# Patient Record
Sex: Female | Born: 1945 | Race: White | Hispanic: No | Smoking: Never smoker
Health system: Southern US, Community
[De-identification: ages and names within clinical notes are randomized; demographics above are authoritative.]

## PROBLEM LIST (undated history)

## (undated) DIAGNOSIS — R011 Cardiac murmur, unspecified: Secondary | ICD-10-CM

## (undated) HISTORY — PX: AMPUTATION OF REPLICATED TOES: SHX1136

---

## 2005-05-10 ENCOUNTER — Other Ambulatory Visit: Admission: RE | Admit: 2005-05-10 | Discharge: 2005-05-10 | Payer: Self-pay | Admitting: Family Medicine

## 2008-10-22 ENCOUNTER — Emergency Department (HOSPITAL_COMMUNITY): Admission: EM | Admit: 2008-10-22 | Discharge: 2008-10-22 | Payer: Self-pay | Admitting: Emergency Medicine

## 2012-04-09 DIAGNOSIS — W57XXXA Bitten or stung by nonvenomous insect and other nonvenomous arthropods, initial encounter: Secondary | ICD-10-CM | POA: Diagnosis not present

## 2012-04-09 DIAGNOSIS — T148 Other injury of unspecified body region: Secondary | ICD-10-CM | POA: Diagnosis not present

## 2012-04-10 ENCOUNTER — Encounter (HOSPITAL_COMMUNITY): Payer: Self-pay | Admitting: *Deleted

## 2012-04-10 ENCOUNTER — Emergency Department (HOSPITAL_COMMUNITY)
Admission: EM | Admit: 2012-04-10 | Discharge: 2012-04-11 | Disposition: A | Discharge status: Home / Self Care | Payer: Medicare Other | Attending: Emergency Medicine | Admitting: Emergency Medicine

## 2012-04-10 DIAGNOSIS — N390 Urinary tract infection, site not specified: Secondary | ICD-10-CM | POA: Diagnosis not present

## 2012-04-10 HISTORY — DX: Cardiac murmur, unspecified: R01.1

## 2012-04-10 LAB — URINE MICROSCOPIC-ADD ON

## 2012-04-10 LAB — CBC WITH DIFFERENTIAL/PLATELET
Basophils Absolute: 0.1 10*3/uL (ref 0.0–0.1)
Basophils Relative: 0 % (ref 0–1)
Eosinophils Relative: 0 % (ref 0–5)
HCT: 39.9 % (ref 36.0–46.0)
Hemoglobin: 13.8 g/dL (ref 12.0–15.0)
Lymphocytes Relative: 14 % (ref 12–46)
MCHC: 34.6 g/dL (ref 30.0–36.0)
MCV: 95.7 fL (ref 78.0–100.0)
Monocytes Absolute: 0.9 10*3/uL (ref 0.1–1.0)
Monocytes Relative: 7 % (ref 3–12)
RDW: 12.7 % (ref 11.5–15.5)

## 2012-04-10 LAB — URINALYSIS, ROUTINE W REFLEX MICROSCOPIC
Glucose, UA: NEGATIVE mg/dL
Ketones, ur: 40 mg/dL — AB
Protein, ur: >300 mg/dL — AB

## 2012-04-10 LAB — BASIC METABOLIC PANEL
BUN: 15 mg/dL (ref 6–23)
CO2: 27 mEq/L (ref 19–32)
Calcium: 9.8 mg/dL (ref 8.4–10.5)
Creatinine, Ser: 0.76 mg/dL (ref 0.50–1.10)

## 2012-04-10 LAB — CK TOTAL AND CKMB (NOT AT ARMC): Total CK: 80 U/L (ref 7–177)

## 2012-04-10 MED ORDER — DEXTROSE 5 % IV SOLN
1.0000 g | Freq: Once | INTRAVENOUS | Status: AC
  Administered 2012-04-10: 1 g via INTRAVENOUS
  Filled 2012-04-10: qty 10

## 2012-04-10 NOTE — ED Provider Notes (Signed)
History     CSN: [id number]  Arrival date & time 04/10/12  4098   First MD Initiated Contact with Patient 04/10/12 2304      Chief Complaint  Patient presents with  . Hematuria    (Consider location/radiation/quality/duration/timing/severity/associated sxs/prior treatment) HPI Hx per PT. Hematuria today with mild suprapubic discomfort. No f/C, no n.V, no back pain.  No dysuria, some frequency tonight.  PT worried b/c she was swarmed by yellow jackets yesterday, evaluated in PCP after hours clinic and took benadryl last night before bed.  No there medications. No vag bleeding or rectal bleeding, no ABd pain otherwise.  Past Medical History  Diagnosis Date  . Heart murmur     History reviewed. No pertinent past surgical history.  No family history on file.  History  Substance Use Topics  . Smoking status: Never Smoker   . Smokeless tobacco: Not on file  . Alcohol Use: Yes     social    OB History    Grav Para Term Preterm Abortions TAB SAB Ect Mult Living                  Review of Systems  Constitutional: Negative for fever and chills.  HENT: Negative for neck pain and neck stiffness.   Eyes: Negative for pain.  Respiratory: Negative for shortness of breath.   Cardiovascular: Negative for chest pain.  Gastrointestinal: Negative for constipation and abdominal distention.  Genitourinary: Positive for hematuria. Negative for dysuria.  Musculoskeletal: Negative for back pain.  Skin: Negative for pallor.  Neurological: Negative for headaches.  All other systems reviewed and are negative.    Allergies  Review of patient's allergies indicates no known allergies.  Home Medications  No current outpatient prescriptions on file.  BP 132/68  Pulse 83  Temp 98.3 F (36.8 C) (Oral)  Resp 20  SpO2 100%  Physical Exam  Constitutional: She is oriented to person, place, and time. She appears well-developed and well-nourished.  HENT:  Head: Normocephalic and  atraumatic.  Eyes: Conjunctivae and EOM are normal. Pupils are equal, round, and reactive to light.  Neck: Trachea normal. Neck supple. No thyromegaly present.  Cardiovascular: Normal rate, regular rhythm, S1 normal, S2 normal and normal pulses.     No systolic murmur is present   No diastolic murmur is present  Pulses:      Radial pulses are 2+ on the right side, and 2+ on the left side.  Pulmonary/Chest: Effort normal and breath sounds normal. She has no wheezes. She has no rhonchi. She has no rales. She exhibits no tenderness.  Abdominal: Soft. Normal appearance and bowel sounds are normal. There is no CVA tenderness and negative Murphy's sign.       Minimal suprapubic tenderness, no tenderness otherwise.   Musculoskeletal:       BLE:s Calves nontender, no cords or erythema, negative Homans sign  Neurological: She is alert and oriented to person, place, and time. She has normal strength. No cranial nerve deficit or sensory deficit. GCS eye subscore is 4. GCS verbal subscore is 5. GCS motor subscore is 6.  Skin: Skin is warm and dry. She is not diaphoretic.  Psychiatric: Her speech is normal.       Cooperative and appropriate    ED Course  Procedures (including critical care time)  Results for orders placed during the hospital encounter of 04/10/12  URINALYSIS, ROUTINE W REFLEX MICROSCOPIC      Component Value Range   Color, Urine RED (*)  YELLOW   APPearance TURBID (*) CLEAR   Specific Gravity, Urine 1.020  1.005 - 1.030   pH 6.5  5.0 - 8.0   Glucose, UA NEGATIVE  NEGATIVE mg/dL   Hgb urine dipstick LARGE (*) NEGATIVE   Bilirubin Urine LARGE (*) NEGATIVE   Ketones, ur 40 (*) NEGATIVE mg/dL   Protein, ur >098 (*) NEGATIVE mg/dL   Urobilinogen, UA 2.0 (*) 0.0 - 1.0 mg/dL   Nitrite POSITIVE (*) NEGATIVE   Leukocytes, UA LARGE (*) NEGATIVE  CBC WITH DIFFERENTIAL      Component Value Range   WBC 13.2 (*) 4.0 - 10.5 K/uL   RBC 4.17  3.87 - 5.11 MIL/uL   Hemoglobin 13.8  12.0 -  15.0 g/dL   HCT 11.9  14.7 - 82.9 %   MCV 95.7  78.0 - 100.0 fL   MCH 33.1  26.0 - 34.0 pg   MCHC 34.6  30.0 - 36.0 g/dL   RDW 56.2  13.0 - 86.5 %   Platelets 187  150 - 400 K/uL   Neutrophils Relative 79 (*) 43 - 77 %   Neutro Abs 10.4 (*) 1.7 - 7.7 K/uL   Lymphocytes Relative 14  12 - 46 %   Lymphs Abs 1.9  0.7 - 4.0 K/uL   Monocytes Relative 7  3 - 12 %   Monocytes Absolute 0.9  0.1 - 1.0 K/uL   Eosinophils Relative 0  0 - 5 %   Eosinophils Absolute 0.1  0.0 - 0.7 K/uL   Basophils Relative 0  0 - 1 %   Basophils Absolute 0.1  0.0 - 0.1 K/uL  BASIC METABOLIC PANEL      Component Value Range   Sodium 139  135 - 145 mEq/L   Potassium 4.1  3.5 - 5.1 mEq/L   Chloride 102  96 - 112 mEq/L   CO2 27  19 - 32 mEq/L   Glucose, Bld 90  70 - 99 mg/dL   BUN 15  6 - 23 mg/dL   Creatinine, Ser 7.84  0.50 - 1.10 mg/dL   Calcium 9.8  8.4 - 69.6 mg/dL   GFR calc non Af Amer 87 (*) >90 mL/min   GFR calc Af Amer >90  >90 mL/min  CK TOTAL AND CKMB      Component Value Range   Total CK 80  7 - 177 U/L   CK, MB 2.2  0.3 - 4.0 ng/mL   Relative Index RELATIVE INDEX IS INVALID  0.0 - 2.5  URINE MICROSCOPIC-ADD ON      Component Value Range   Squamous Epithelial / LPF RARE  RARE   WBC, UA TOO NUMEROUS TO COUNT  <3 WBC/hpf   RBC / HPF TOO NUMEROUS TO COUNT  <3 RBC/hpf   Bacteria, UA FEW (*) RARE   Casts HYALINE CASTS (*) NEGATIVE    IV rocephin  U cx pending  UTI without fever or vomiting or back pain or clinical pyelo/ sepsis.   Plan d/c home, PCP follow up recheck and follow up culture results MDM   VS and nursing notes reviewed. IVFs. IV ABx. Labs as above - crt 0.76, total CK WNL  UA reviewed - treated for UTI        Sunnie Nielsen, MD 04/11/12 0004

## 2012-04-10 NOTE — ED Notes (Signed)
Pt stung by 27 yellow jackets yesterday; seen by Urgent care; today developed increasing amts of hematuria with slight abd cramping; noticed "specs" in urine; was told to come to ED by PCP

## 2012-04-11 MED ORDER — CEFPODOXIME PROXETIL 200 MG PO TABS: 200.0000 mg | ORAL_TABLET | Freq: Two times a day (BID) | ORAL | Status: AC

## 2012-04-11 NOTE — ED Notes (Signed)
Patient is alert and oriented x3.  She was given DC instructions and follow up visit instructions.  Patient gave verbal understanding. She was DC ambulatory under his own power to home.  V/S stable.  He was not showing any signs of distress on DC 

## 2012-04-14 LAB — URINE CULTURE: Colony Count: >=100000

## 2012-04-15 NOTE — ED Notes (Signed)
+   Urine Patient treated with Vantin-sensitive to same-chart appended per protocol MD. 

## 2012-08-07 DIAGNOSIS — Z23 Encounter for immunization: Secondary | ICD-10-CM | POA: Diagnosis not present

## 2012-12-25 ENCOUNTER — Other Ambulatory Visit: Payer: Self-pay

## 2012-12-25 ENCOUNTER — Ambulatory Visit
Admission: RE | Admit: 2012-12-25 | Discharge: 2012-12-25 | Disposition: A | Discharge status: Home / Self Care | Payer: Medicare Other | Source: Ambulatory Visit

## 2012-12-25 DIAGNOSIS — R0602 Shortness of breath: Secondary | ICD-10-CM

## 2012-12-25 DIAGNOSIS — R5383 Other fatigue: Secondary | ICD-10-CM | POA: Diagnosis not present

## 2012-12-25 DIAGNOSIS — R509 Fever, unspecified: Secondary | ICD-10-CM | POA: Diagnosis not present

## 2012-12-25 DIAGNOSIS — R5381 Other malaise: Secondary | ICD-10-CM | POA: Diagnosis not present

## 2012-12-31 DIAGNOSIS — R509 Fever, unspecified: Secondary | ICD-10-CM | POA: Diagnosis not present

## 2013-01-07 ENCOUNTER — Other Ambulatory Visit: Payer: Self-pay

## 2013-01-07 ENCOUNTER — Other Ambulatory Visit (HOSPITAL_COMMUNITY)
Admission: RE | Admit: 2013-01-07 | Discharge: 2013-01-07 | Disposition: A | Discharge status: Home / Self Care | Payer: Medicare Other | Source: Ambulatory Visit

## 2013-01-07 DIAGNOSIS — N951 Menopausal and female climacteric states: Secondary | ICD-10-CM | POA: Diagnosis not present

## 2013-01-07 DIAGNOSIS — Z124 Encounter for screening for malignant neoplasm of cervix: Secondary | ICD-10-CM | POA: Diagnosis not present

## 2013-01-07 DIAGNOSIS — Z1211 Encounter for screening for malignant neoplasm of colon: Secondary | ICD-10-CM | POA: Diagnosis not present

## 2013-01-07 DIAGNOSIS — Z23 Encounter for immunization: Secondary | ICD-10-CM | POA: Diagnosis not present

## 2013-01-07 DIAGNOSIS — R634 Abnormal weight loss: Secondary | ICD-10-CM | POA: Diagnosis not present

## 2013-01-07 DIAGNOSIS — Z136 Encounter for screening for cardiovascular disorders: Secondary | ICD-10-CM | POA: Diagnosis not present

## 2013-01-07 DIAGNOSIS — Z0000 Encounter for general adult medical examination without abnormal findings: Secondary | ICD-10-CM | POA: Diagnosis not present

## 2013-08-19 DIAGNOSIS — Z23 Encounter for immunization: Secondary | ICD-10-CM | POA: Diagnosis not present

## 2013-10-14 DIAGNOSIS — J40 Bronchitis, not specified as acute or chronic: Secondary | ICD-10-CM | POA: Diagnosis not present

## 2014-03-03 DIAGNOSIS — Z23 Encounter for immunization: Secondary | ICD-10-CM | POA: Diagnosis not present

## 2014-03-03 DIAGNOSIS — N951 Menopausal and female climacteric states: Secondary | ICD-10-CM | POA: Diagnosis not present

## 2014-03-03 DIAGNOSIS — Z1211 Encounter for screening for malignant neoplasm of colon: Secondary | ICD-10-CM | POA: Diagnosis not present

## 2014-03-03 DIAGNOSIS — Z0000 Encounter for general adult medical examination without abnormal findings: Secondary | ICD-10-CM | POA: Diagnosis not present

## 2014-03-08 DIAGNOSIS — Z136 Encounter for screening for cardiovascular disorders: Secondary | ICD-10-CM | POA: Diagnosis not present

## 2014-03-08 DIAGNOSIS — Z131 Encounter for screening for diabetes mellitus: Secondary | ICD-10-CM | POA: Diagnosis not present

## 2014-03-08 DIAGNOSIS — Z0000 Encounter for general adult medical examination without abnormal findings: Secondary | ICD-10-CM | POA: Diagnosis not present

## 2014-03-09 DIAGNOSIS — H2510 Age-related nuclear cataract, unspecified eye: Secondary | ICD-10-CM | POA: Diagnosis not present

## 2014-03-28 ENCOUNTER — Other Ambulatory Visit: Payer: Self-pay

## 2014-03-28 DIAGNOSIS — Z1231 Encounter for screening mammogram for malignant neoplasm of breast: Secondary | ICD-10-CM

## 2014-03-30 ENCOUNTER — Ambulatory Visit
Admission: RE | Admit: 2014-03-30 | Discharge: 2014-03-30 | Disposition: A | Discharge status: Home / Self Care | Payer: Medicare Other | Source: Ambulatory Visit

## 2014-03-30 DIAGNOSIS — Z1231 Encounter for screening mammogram for malignant neoplasm of breast: Secondary | ICD-10-CM

## 2014-04-05 DIAGNOSIS — N951 Menopausal and female climacteric states: Secondary | ICD-10-CM | POA: Diagnosis not present

## 2014-04-05 DIAGNOSIS — M810 Age-related osteoporosis without current pathological fracture: Secondary | ICD-10-CM | POA: Diagnosis not present

## 2014-04-15 IMAGING — CR DG CHEST 2V
2 series · 2 of 2 positions shown · non-contrast
Comparison: None.

CLINICAL DATA: Short of breath, fever, chills, recent weight loss

CHEST - 2 VIEW

[view not recorded (1 of 2)]
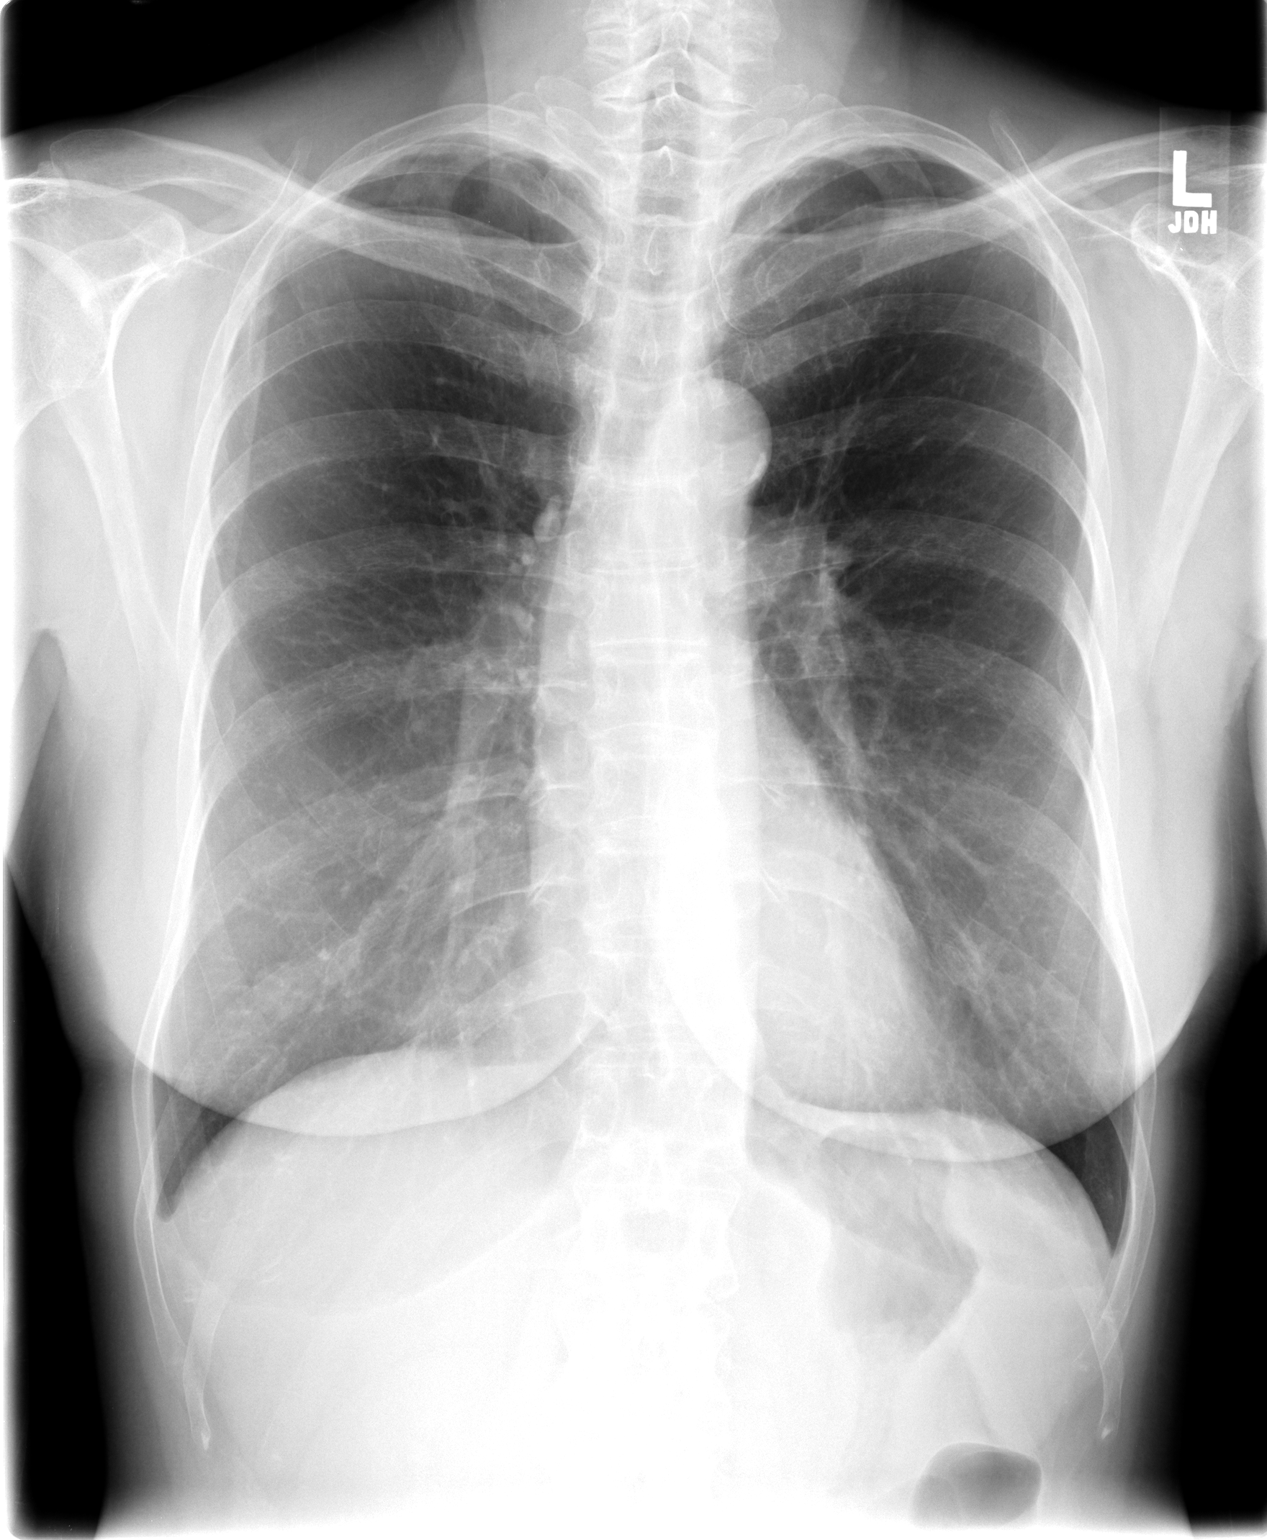

[view not recorded (2 of 2)]
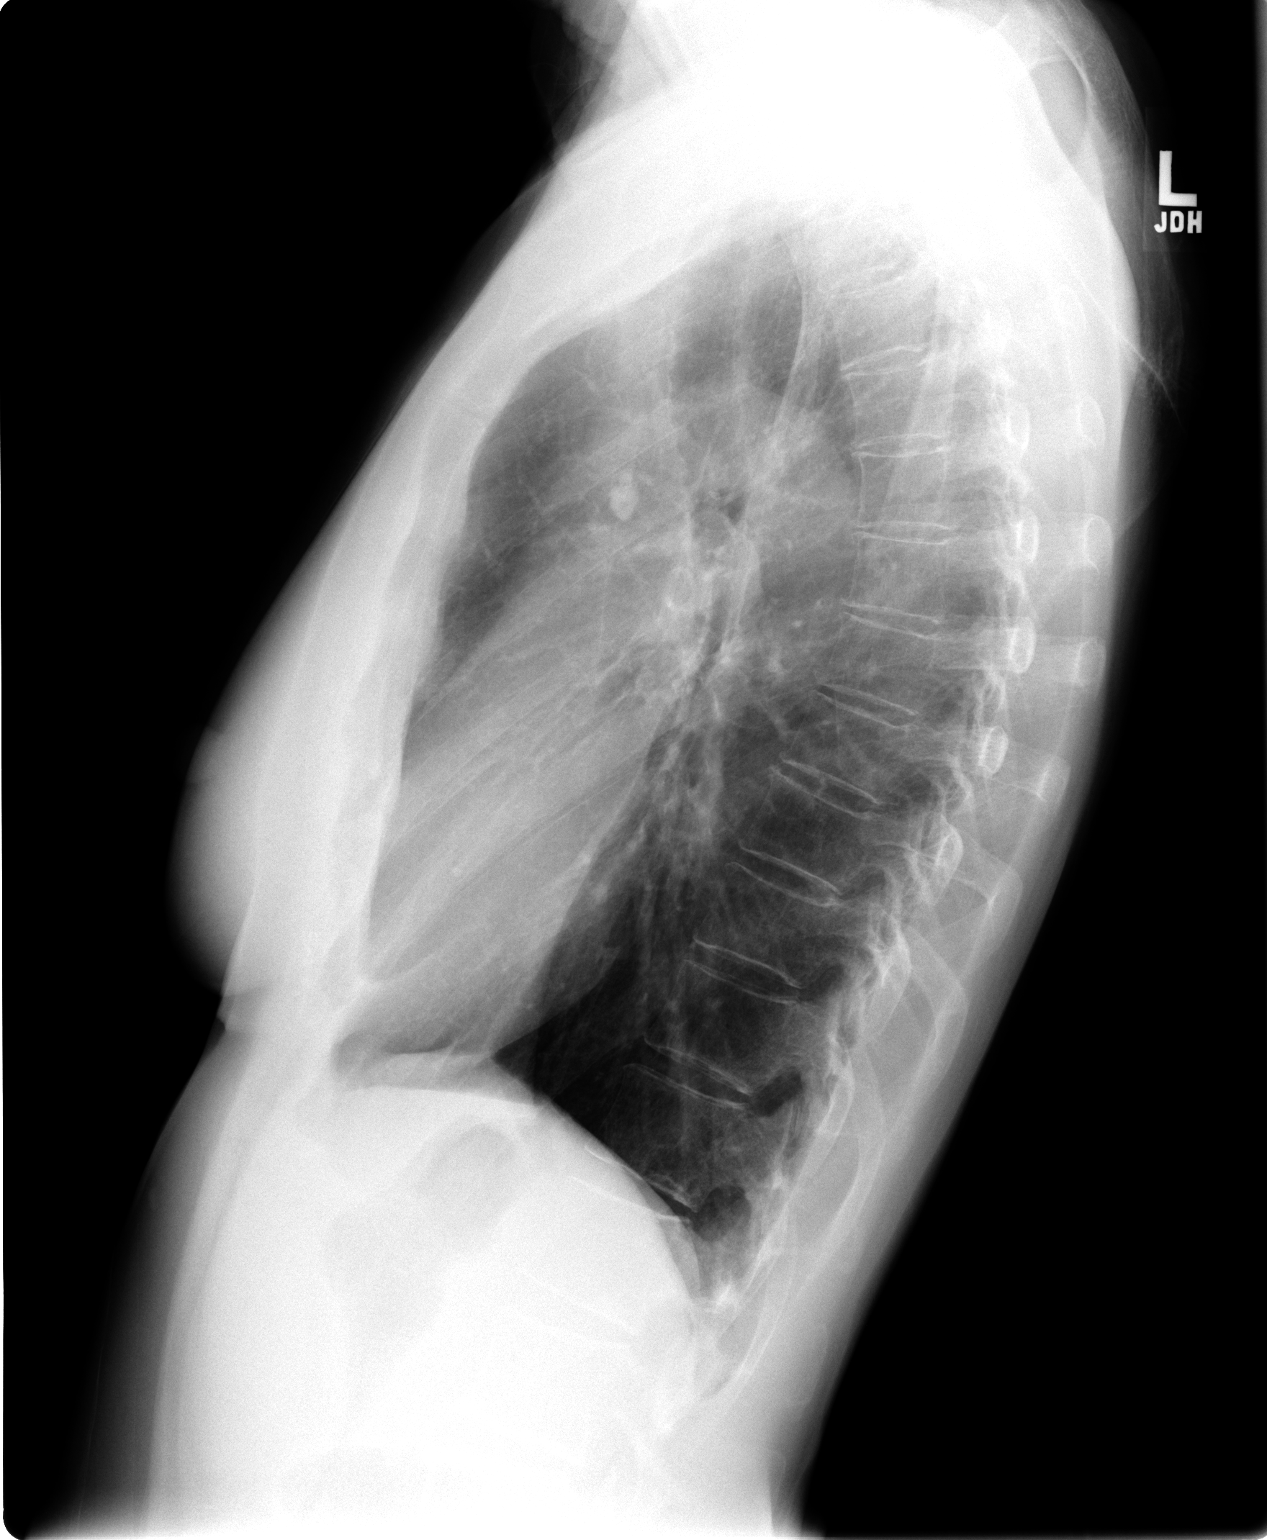

[2 of 2 positions shown; findings below may reference images not displayed]

FINDINGS: No active infiltrate or effusion is seen.  There do
appear to be calcified right paratracheal nodes which may indicate
prior granulomatous disease.  Mediastinal contours are normal.  The
heart is within normal limits in size.  No bony abnormality is
seen.
IMPRESSION: No active lung disease.  Calcified left peritracheal nodes may
indicate prior granulomatous disease.

## 2014-04-20 DIAGNOSIS — M810 Age-related osteoporosis without current pathological fracture: Secondary | ICD-10-CM | POA: Diagnosis not present

## 2014-04-20 DIAGNOSIS — E559 Vitamin D deficiency, unspecified: Secondary | ICD-10-CM | POA: Diagnosis not present

## 2014-07-19 DIAGNOSIS — M25531 Pain in right wrist: Secondary | ICD-10-CM | POA: Diagnosis not present

## 2014-07-19 DIAGNOSIS — Z23 Encounter for immunization: Secondary | ICD-10-CM | POA: Diagnosis not present

## 2014-07-20 ENCOUNTER — Other Ambulatory Visit: Payer: Self-pay | Admitting: Family Medicine

## 2014-07-20 ENCOUNTER — Ambulatory Visit
Admission: RE | Admit: 2014-07-20 | Discharge: 2014-07-20 | Disposition: A | Discharge status: Home / Self Care | Payer: Medicare Other | Source: Ambulatory Visit | Attending: Family Medicine | Admitting: Family Medicine

## 2014-07-20 DIAGNOSIS — M25531 Pain in right wrist: Secondary | ICD-10-CM

## 2016-06-04 ENCOUNTER — Other Ambulatory Visit: Payer: Self-pay | Admitting: Family Medicine

## 2016-06-04 ENCOUNTER — Ambulatory Visit
Admission: RE | Admit: 2016-06-04 | Discharge: 2016-06-04 | Disposition: A | Discharge status: Home / Self Care | Payer: Medicare Other | Source: Ambulatory Visit | Attending: Family Medicine | Admitting: Family Medicine

## 2016-06-04 DIAGNOSIS — M25552 Pain in left hip: Secondary | ICD-10-CM

## 2016-06-04 DIAGNOSIS — M25551 Pain in right hip: Secondary | ICD-10-CM

## 2019-04-21 ENCOUNTER — Other Ambulatory Visit: Payer: Self-pay | Admitting: Family Medicine

## 2019-04-21 DIAGNOSIS — Z1231 Encounter for screening mammogram for malignant neoplasm of breast: Secondary | ICD-10-CM

## 2019-06-03 ENCOUNTER — Ambulatory Visit
Admission: RE | Admit: 2019-06-03 | Discharge: 2019-06-03 | Disposition: A | Discharge status: Home / Self Care | Payer: Medicare Other | Source: Ambulatory Visit | Attending: Family Medicine | Admitting: Family Medicine

## 2019-06-03 ENCOUNTER — Other Ambulatory Visit: Payer: Self-pay

## 2019-06-03 DIAGNOSIS — Z1231 Encounter for screening mammogram for malignant neoplasm of breast: Secondary | ICD-10-CM

## 2019-10-04 ENCOUNTER — Encounter: Payer: Self-pay | Admitting: Neurology

## 2019-11-09 ENCOUNTER — Encounter: Payer: Self-pay | Admitting: Neurology

## 2019-11-09 ENCOUNTER — Ambulatory Visit: Payer: Medicare Other | Admitting: Neurology

## 2019-11-09 ENCOUNTER — Other Ambulatory Visit: Payer: Self-pay

## 2019-11-09 VITALS — BP 145/82 | HR 65 | Ht 68.0 in | Wt 123.8 lb

## 2019-11-09 DIAGNOSIS — G3184 Mild cognitive impairment, so stated: Secondary | ICD-10-CM

## 2019-11-09 NOTE — Patient Instructions (Addendum)
1. Schedule MRI brain without contrast, once your insurance has been authorized. [facility] will contact you. If you have not heard from them give them a call at [phone]  2. Bloodwork from Dr. Hyman Hopes will be requested for review  3. Take the B12 supplement daily  4. Continue to monitor stress, mood, as these can also affect our memory  5. Follow-up in 6 months, call for any changes   RECOMMENDATIONS FOR ALL PATIENTS WITH MEMORY PROBLEMS: 1. Continue to exercise (Recommend 30 minutes of walking everyday, or 3 hours every week) 2. Increase social interactions - continue going to [place] and enjoy social gatherings with friends and family 3. Eat healthy, avoid fried foods and eat more fruits and vegetables 4. Maintain adequate blood pressure, blood sugar, and blood cholesterol level. Reducing the risk of stroke and cardiovascular disease also helps promoting better memory. 5. Avoid stressful situations. Live a simple life and avoid aggravations. Organize your time and prepare for the next day in anticipation. 6. Sleep well, avoid any interruptions of sleep and avoid any distractions in the bedroom that may interfere with adequate sleep quality 7. Avoid sugar, avoid sweets as there is a strong link between excessive sugar intake, diabetes, and cognitive impairment We discussed the Mediterranean diet, which has been shown to help patients reduce the risk of progressive memory disorders and reduces cardiovascular risk. This includes eating fish, eat fruits and green leafy vegetables, nuts like almonds and hazelnuts, walnuts, and also use olive oil. Avoid fast foods and fried foods as much as possible. Avoid sweets and sugar as sugar use has been linked to worsening of memory function.

## 2019-11-09 NOTE — Progress Notes (Signed)
NEUROLOGY CONSULTATION NOTE  Sarah Christian MRN: [id number] DOB: March 21, 1946  Referring provider: Dr. Shirlean Christian Primary care provider: Dr. Shirlean Christian  Reason for consult:  Memory loss  Dear Dr Sarah Christian:  Thank you for your kind referral of Sarah Christian for consultation of the above symptoms. Although her history is well known to you, please allow me to reiterate it for the purpose of our medical record. She is alone in the office today. Records and images were personally reviewed where available.   HISTORY OF PRESENT ILLNESS: This is a pleasant 74 year old right-handed woman with no significant past medical history presenting for evaluation of memory loss. She reports family members started noticing memory changes a few months ago, she helps take care of her grandchildren and her daughter-in-law noticed there were certain things she may not remember and would have to ask again. Her son talked to her about not remembering the schedule for her grandbaby. She lives alone and denies getting lost driving. No missed bill payments. She was not taking medication regularly until she was recently started on B12 supplements after she had a B12 level of 247 last 09/2019. She states she is not good with taking this regularly. She denies misplacing things or leaving the stove on. She reports poor sleep recently due to stress. Her ex-husband who she has been separated from for 15 years moved from [place] to live with their son in [place] but had issues, so they decided he move in with her several weeks ago so they can keep each other company. This has ben stressful for her, she feels her mood is a lot better, but notes different personality from her ex-husband. Her mother had memory issues. No history of concussions. She drinks alcohol on occasion.  She has occasional back pain. She denies any headaches, dizziness, diplopia, dysarthria, dysphagia, neck pain, focal numbness/tingling/weakness, bowel/bladder  dysfunction. No anosmia, tremors, no falls.  Laboratory Data: Labs done 2/21: TSH 1.10, B12 247  PAST MEDICAL HISTORY: Past Medical History:  Diagnosis Date  . Heart murmur     PAST SURGICAL HISTORY: Past Surgical History:  Procedure Laterality Date  . AMPUTATION OF REPLICATED TOES Bilateral     MEDICATIONS: Current Outpatient Medications on File Prior to Visit  Medication Sig Dispense Refill  . acetaminophen (TYLENOL) 325 MG tablet Take 650 mg by mouth every 6 (six) hours as needed.    Marland Kitchen aspirin 81 MG tablet Take 81 mg by mouth daily.     No current facility-administered medications on file prior to visit.    ALLERGIES: Allergies  Allergen Reactions  . Tessalon [Benzonatate]     FAMILY HISTORY: Family History  Problem Relation Age of Onset  . Alzheimer's disease Mother   . Diabetes Sister   . Diabetes Brother     SOCIAL HISTORY: Social History   Socioeconomic History  . Marital status: Widowed    Spouse name: Not on file  . Number of children: Not on file  . Years of education: Not on file  . Highest education level: Not on file  Occupational History  . Not on file  Tobacco Use  . Smoking status: Never Smoker  Substance and Sexual Activity  . Alcohol use: Yes    Alcohol/week: 1.0 standard drinks    Types: 1 Glasses of wine per week    Comment: social  . Drug use: Not on file  . Sexual activity: Not on file  Other Topics Concern  . Not on  file  Social History Narrative   Right handed   Lives one story   Lives with a alone, has ex-husband there for right now   Social Determinants of Health   Financial Resource Strain:   . Difficulty of Paying Living Expenses:   Food Insecurity:   . Worried About Charity fundraiser in the Last Year:   . Arboriculturist in the Last Year:   Transportation Needs:   . Film/video editor (Medical):   Marland Kitchen Lack of Transportation (Non-Medical):   Physical Activity:   . Days of Exercise per Week:   . Minutes of  Exercise per Session:   Stress:   . Feeling of Stress :   Social Connections:   . Frequency of Communication with Friends and Family:   . Frequency of Social Gatherings with Friends and Family:   . Attends Religious Services:   . Active Member of Clubs or Organizations:   . Attends Archivist Meetings:   Marland Kitchen Marital Status:   Intimate Partner Violence:   . Fear of Current or Ex-Partner:   . Emotionally Abused:   Marland Kitchen Physically Abused:   . Sexually Abused:     REVIEW OF SYSTEMS: Constitutional: No fevers, chills, or sweats, no generalized fatigue, change in appetite Eyes: No visual changes, double vision, eye pain Ear, nose and throat: No hearing loss, ear pain, nasal congestion, sore throat Cardiovascular: No chest pain, palpitations Respiratory:  No shortness of breath at rest or with exertion, wheezes GastrointestinaI: No nausea, vomiting, diarrhea, abdominal pain, fecal incontinence Genitourinary:  No dysuria, urinary retention or frequency Musculoskeletal:  No neck pain,+back pain Integumentary: No rash, pruritus, skin lesions Neurological: as above Psychiatric: No depression, insomnia, anxiety Endocrine: No palpitations, fatigue, diaphoresis, mood swings, change in appetite, change in weight, increased thirst Hematologic/Lymphatic:  No anemia, purpura, petechiae. Allergic/Immunologic: no itchy/runny eyes, nasal congestion, recent allergic reactions, rashes  PHYSICAL EXAM: Vitals:   11/09/19 1303  BP: (!) 145/82  Pulse: 65  SpO2: 99%   General: No acute distress Head:  Normocephalic/atraumatic Skin/Extremities: No rash, no edema Neurological Exam: Mental status: alert and oriented to person, place, and time, no dysarthria or aphasia, Fund of knowledge is appropriate.  Remote memory  intact.  Attention and concentration are normal.    SLUMS score 23/30 [employer] Mental Exam 11/09/2019  Weekday Correct 1  Current year 1  What state are we in? 1    Amount spent 1  Amount left 2  # of Animals 2  5 objects recall 1  Number series 2  Hour markers 2  Time correct 2  Placed X in triangle correctly 1  Largest Figure 1  Name of female 2  Date back to work 2  Type of work 2  State she lived in 0  Total score 23    Cranial nerves: CN I: not tested CN II: pupils equal, round and reactive to light, visual fields intact CN III, IV, VI:  full range of motion, no nystagmus, no ptosis CN V: facial sensation intact CN VII: upper and lower face symmetric CN VIII: hearing intact to conversation Bulk & Tone: normal, no fasciculations. Motor: 5/5 throughout with no pronator drift. Sensation: intact to light touch, cold, pin, vibration and joint position sense.  No extinction to double simultaneous stimulation.  Romberg test negative Deep Tendon Reflexes: +2 throughout, no ankle clonus Plantar responses: downgoing bilaterally Cerebellar: no incoordination on finger to nose testing Gait: narrow-based and steady, able  to tandem walk adequately. Tremor: none  IMPRESSION: This is a pleasant 74 year old right-handed woman with no significant past medical history presenting for evaluation of memory loss. Her neurological exam is non-focal, SLUMS score 23/30. She denies any difficulties with complex tasks, however is alone today with no family to corroborate history. 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 We discussed different causes of memory changes, she was advised to take B12 supplements regularly, goal level >400. MRI brain without contrast will be ordered to assess for underlying structural abnormality and assess vascular load. We also discussed effects of mood/stress on memory, she endorses stress with recent living situation, continue to monitor. Follow-up in 6 months, she knows to call for any changes.   Thank you for allowing me to participate in the care of this patient. Please do not hesitate to call for any questions or  concerns.   Patrcia Dolly, M.D.  CC: Dr. Hyman Christian

## 2019-12-20 NOTE — Progress Notes (Signed)
Cardiology Office Note   Date:  12/21/2019   ID:  [name], DOB [birth year], MRN [id number]  PCP:  Maurice Small, MD  Cardiologist:   Skeet Latch, MD   No chief complaint on file.     History of Present Illness: Sarah Christian is a 74 y.o. female with hyperlipidemia memory loss who presents for evaluation of new left bundle branch block at the request of Maurice Small, MD.  seh saw Dr. Justin Mend because she had an alleric reaction.  She had itching on her hands and feet.  She called EMS and they did an EKG.  EKG showed a LBBB.  She felt otherwise well.  She does yard work and walks for exercise.  She has no exertional chest pain or shortness of breath.  Her only complaints are orthopedic.  She has no lower extremity edema, orthopnea or PND.  She denies palpitations, lightheadedness or PND.  She notes that she has a heart murmur.      Past Medical History:  Diagnosis Date  . Heart murmur   . LBBB (left bundle branch block) 12/21/2019  . Memory loss 12/21/2019    Past Surgical History:  Procedure Laterality Date  . AMPUTATION OF REPLICATED TOES Bilateral      Current Outpatient Medications  Medication Sig Dispense Refill  . acetaminophen (TYLENOL) 325 MG tablet Take 650 mg by mouth every 6 (six) hours as needed.     No current facility-administered medications for this visit.    Allergies:   Tessalon [benzonatate]    Social History:  The patient  reports that she has never smoked. She has never used smokeless tobacco. She reports current alcohol use of about 1.0 standard drinks of alcohol per week.   Family History:  The patient's family history includes Alzheimer's disease in her mother; Diabetes in her brother and sister; Hypertension in her mother; Kidney disease in her sister.    ROS:  Please see the history of present illness.   Otherwise, review of systems are positive for none.   All other systems are reviewed and negative.    PHYSICAL EXAM: VS:  BP 124/78    Pulse 62   Ht 5\' 8"  (1.727 m)   Wt 122 lb 12.8 oz (55.7 kg)   BMI 18.67 kg/m  , BMI Body mass index is 18.67 kg/m. GENERAL:  Well appearing HEENT:  Pupils equal round and reactive, fundi not visualized, oral mucosa unremarkable NECK:  No jugular venous distention, waveform within normal limits, carotid upstroke brisk and symmetric, no bruits, no thyromegaly LYMPHATICS:  No cervical adenopathy LUNGS:  Clear to auscultation bilaterally HEART:  RRR.  PMI not displaced or sustained,S1 and S2 within normal limits, no S3, no S4, no clicks, no rubs, no murmurs ABD:  Flat, positive bowel sounds normal in frequency in pitch, no bruits, no rebound, no guarding, no midline pulsatile mass, no hepatomegaly, no splenomegaly EXT:  2 plus pulses throughout, no edema, no cyanosis no clubbing SKIN:  No rashes no nodules NEURO:  Cranial nerves II through XII grossly intact, motor grossly intact throughout PSYCH:  Cognitively intact, oriented to person place and time    EKG:  EKG is ordered today. The ekg ordered today demonstrates sinus rhythm.  Rate 62 bpm.  LBBB   Recent Labs: No results found for requested labs within last 8760 hours.   10/07/2019: WBC 4.6, hemoglobin 12.9, hematocrit 38.3, platelets 187 Sodium 139, potassium 4.5, BUN 16, creatinine 0.65 AST 22, ALT  17 TSH 1.1 Total cholesterol 187, triglycerides 61, HDL 55, LDL 119  Lipid Panel No results found for: CHOL, TRIG, HDL, CHOLHDL, VLDL, LDLCALC, LDLDIRECT    Wt Readings from Last 3 Encounters:  12/21/19 122 lb 12.8 oz (55.7 kg)  11/09/19 123 lb 12.8 oz (56.2 kg)      ASSESSMENT AND PLAN:  # LBBB:  [name] was incidentally noted to have a left bundle branch block.  She is completely asymptomatic.  She exercises regularly has no chest pain or shortness of breath.  She has no evidence of heart failure on exam or by report.  No further intervention necessary.  # Hyperlipidemia:   Continue follow-up with Dr. Hyman Hopes.  She is  monitoring this with diet and exercise.  Current medicines are reviewed at length with the patient today.  The patient does not have concerns regarding medicines.  The following changes have been made:  no change  Labs/ tests ordered today include:  No orders of the defined types were placed in this encounter.    Disposition:   FU with Tracy Kinner C. Duke Salvia, MD, [facility] as needed     Signed, Ksenia Kunz C. Duke Salvia, MD, [facility]  12/21/2019 9:35 AM    [facility]

## 2019-12-21 ENCOUNTER — Ambulatory Visit (INDEPENDENT_AMBULATORY_CARE_PROVIDER_SITE_OTHER): Payer: Medicare Other | Admitting: Cardiovascular Disease

## 2019-12-21 ENCOUNTER — Encounter: Payer: Self-pay | Admitting: Cardiovascular Disease

## 2019-12-21 ENCOUNTER — Other Ambulatory Visit: Payer: Self-pay

## 2019-12-21 DIAGNOSIS — I447 Left bundle-branch block, unspecified: Secondary | ICD-10-CM | POA: Diagnosis not present

## 2019-12-21 DIAGNOSIS — R413 Other amnesia: Secondary | ICD-10-CM

## 2019-12-21 HISTORY — DX: Left bundle-branch block, unspecified: I44.7

## 2019-12-21 NOTE — Patient Instructions (Signed)
Medication Instructions:  ?Your physician recommends that you continue on your current medications as directed. Please refer to the Current Medication list given to you today.  ? ?Labwork: ?NONE ? ?Testing/Procedures: ?NONE ? ?Follow-Up: ?AS NEEDED  ? ?  ?

## 2020-01-03 NOTE — Addendum Note (Signed)
Addended by: Chana Bode on: 01/03/2020 04:38 PM   Modules accepted: Orders

## 2020-05-15 ENCOUNTER — Ambulatory Visit (INDEPENDENT_AMBULATORY_CARE_PROVIDER_SITE_OTHER): Payer: Medicare Other | Admitting: Neurology

## 2020-05-15 ENCOUNTER — Encounter: Payer: Self-pay | Admitting: Neurology

## 2020-05-15 ENCOUNTER — Other Ambulatory Visit: Payer: Self-pay

## 2020-05-15 VITALS — BP 131/67 | HR 75 | Resp 18 | Ht 68.0 in | Wt 121.0 lb

## 2020-05-15 DIAGNOSIS — G3184 Mild cognitive impairment, so stated: Secondary | ICD-10-CM

## 2020-05-15 NOTE — Progress Notes (Signed)
NEUROLOGY FOLLOW UP OFFICE NOTE  Sarah Christian [id number] 09/25/1945  HISTORY OF PRESENT ILLNESS: I had the pleasure of seeing Sarah Christian in follow-up in the neurology clinic on 05/15/2020.  The patient was last seen 6 months ago for memory loss.  Records and images were personally reviewed where available.  SLUMS score 23/30 in 10/2019. She did not have MRI brain done. She is alone in the office today and states she is overall doing fine. She forgets what she went to get in a room. She lives alone. She denies getting lost driving, no missed bills, most are online. She is not taking any regular medications. She denies leaving the stove on. She denies any difficulties using the computer. On her prior visit, she reported a lot of stress with her living situation with her ex-husband, he has since moved out and she endorses less stress. She denies any headaches, dizziness, focal numbness/tingling/weakness, no falls. Sleep and mood are good.    History on Initial Assessment 11/09/2019: This is a pleasant 74 year old right-handed woman with no significant past medical history presenting for evaluation of memory loss. She reports family members started noticing memory changes a few months ago, she helps take care of her grandchildren and her daughter-in-law noticed there were certain things she may not remember and would have to ask again. Her son talked to her about not remembering the schedule for her grandbaby. She lives alone and denies getting lost driving. No missed bill payments. She was not taking medication regularly until she was recently started on B12 supplements after she had a B12 level of 247 last 09/2019. She states she is not good with taking this regularly. She denies misplacing things or leaving the stove on. She reports poor sleep recently due to stress. Her ex-husband who she has been separated from for 15 years moved from [place] to live with their son in [place] but had issues, so they  decided he move in with her several weeks ago so they can keep each other company. This has ben stressful for her, she feels her mood is a lot better, but notes different personality from her ex-husband. Her mother had memory issues. No history of concussions. She drinks alcohol on occasion.  She has occasional back pain. She denies any headaches, dizziness, diplopia, dysarthria, dysphagia, neck pain, focal numbness/tingling/weakness, bowel/bladder dysfunction. No anosmia, tremors, no falls.  Laboratory Data: Labs done 2/21: TSH 1.10, B12 247   PAST MEDICAL HISTORY: Past Medical History:  Diagnosis Date   Heart murmur    LBBB (left bundle branch block) 12/21/2019   Memory loss 12/21/2019    MEDICATIONS: Current Outpatient Medications on File Prior to Visit  Medication Sig Dispense Refill   acetaminophen (TYLENOL) 325 MG tablet Take 650 mg by mouth every 6 (six) hours as needed. (Patient not taking: Reported on 05/15/2020)     No current facility-administered medications on file prior to visit.    ALLERGIES: Allergies  Allergen Reactions   Tessalon [Benzonatate]     FAMILY HISTORY: Family History  Problem Relation Age of Onset   Alzheimer's disease Mother    Hypertension Mother    Diabetes Sister    Kidney disease Sister    Diabetes Brother     SOCIAL HISTORY: Social History   Socioeconomic History   Marital status: Widowed    Spouse name: Not on file   Number of children: Not on file   Years of education: Not on file   Highest  education level: Not on file  Occupational History   Not on file  Tobacco Use   Smoking status: Never Smoker   Smokeless tobacco: Never Used  Vaping Use   Vaping Use: Never used  Substance and Sexual Activity   Alcohol use: Yes    Alcohol/week: 1.0 standard drink    Types: 1 Glasses of wine per week    Comment: social   Drug use: Not on file   Sexual activity: Not on file  Other Topics Concern   Not on file    Social History Narrative   Right handed   Lives one story   Lives with a alone, has ex-husband there for right now   Social Determinants of Health   Financial Resource Strain:    Difficulty of Paying Living Expenses: Not on file  Food Insecurity:    Worried About Running Out of Food in the Last Year: Not on file   [employer] of Food in the Last Year: Not on file  Transportation Needs:    Lack of Transportation (Medical): Not on file   Lack of Transportation (Non-Medical): Not on file  Physical Activity:    Days of Exercise per Week: Not on file   Minutes of Exercise per Session: Not on file  Stress:    Feeling of Stress : Not on file  Social Connections:    Frequency of Communication with Friends and Family: Not on file   Frequency of Social Gatherings with Friends and Family: Not on file   Attends Religious Services: Not on file   Active Member of Clubs or Organizations: Not on file   Attends Banker Meetings: Not on file   Marital Status: Not on file  Intimate Partner Violence:    Fear of Current or Ex-Partner: Not on file   Emotionally Abused: Not on file   Physically Abused: Not on file   Sexually Abused: Not on file     PHYSICAL EXAM: Vitals:   05/15/20 1428  BP: 131/67  Pulse: 75  Resp: 18  SpO2: 99%   General: No acute distress Head:  Normocephalic/atraumatic Skin/Extremities: No rash, no edema Neurological Exam: alert and oriented to person, place, and time. No aphasia or dysarthria. Fund of knowledge is appropriate.  Recent and remote memory are impaired.  Attention and concentration are normal. MOCA 24/30 Montreal Cognitive Assessment  05/15/2020  Visuospatial/ Executive (0/5) 5  Naming (0/3) 3  Attention: Read list of digits (0/2) 2  Attention: Read list of letters (0/1) 1  Attention: Serial 7 subtraction starting at 100 (0/3) 3  Language: Repeat phrase (0/2) 2  Language : Fluency (0/1) 1  Abstraction (0/2) 2  Delayed  Recall (0/5) 0  Orientation (0/6) 6  Total 25     Cranial nerves: Pupils equal, round. Extraocular movements intact with no nystagmus. Visual fields full.  No facial asymmetry.  Motor: Bulk and tone normal, muscle strength 5/5 throughout with no pronator drift.   Finger to nose testing intact.  Gait narrow-based and steady, able to tandem walk adequately.  Romberg negative.   IMPRESSION: This is a pleasant 74 yo RH woman with no significant past medical history with memory loss. MOCA score today 25/30, she denies any difficulties with complex tasks however she is alone in the office with no family to corroborate history. Symptoms suggestive of amnestic MCI. We discussed proceed with MRI brain without contrast as previously discussed to assess for underlying structural abnormality and assess vascular load. She will  be scheduled for Neurocognitive testing to further evaluate cognitive concerns. We again discussed the importance of control of vascular risk factors, physical exercise, and brain stimulation exercises for brain health. Follow-up in 6 months, she knows to call for any changes.    Thank you for allowing me to participate in her care.  Please do not hesitate to call for any questions or concerns.   Patrcia Dolly, M.D.   CC: Dr. Hyman Hopes

## 2020-05-15 NOTE — Patient Instructions (Addendum)
1. Schedule MRI brain without contrast  2. Scheduled Neurocognitive testing  3. Follow-up in 6 months, call for any changes   RECOMMENDATIONS FOR ALL PATIENTS WITH MEMORY PROBLEMS: 1. Continue to exercise (Recommend 30 minutes of walking everyday, or 3 hours every week) 2. Increase social interactions - continue going to [place] and enjoy social gatherings with friends and family 3. Eat healthy, avoid fried foods and eat more fruits and vegetables 4. Maintain adequate blood pressure, blood sugar, and blood cholesterol level. Reducing the risk of stroke and cardiovascular disease also helps promoting better memory. 5. Avoid stressful situations. Live a simple life and avoid aggravations. Organize your time and prepare for the next day in anticipation. 6. Sleep well, avoid any interruptions of sleep and avoid any distractions in the bedroom that may interfere with adequate sleep quality 7. Avoid sugar, avoid sweets as there is a strong link between excessive sugar intake, diabetes, and cognitive impairment The Mediterranean diet has been shown to help patients reduce the risk of progressive memory disorders and reduces cardiovascular risk. This includes eating fish, eat fruits and green leafy vegetables, nuts like almonds and hazelnuts, walnuts, and also use olive oil. Avoid fast foods and fried foods as much as possible. Avoid sweets and sugar as sugar use has been linked to worsening of memory function.  There is always a concern of gradual progression of memory problems. If this is the case, then we may need to adjust level of care according to patient needs. Support, both to the patient and caregiver, should then be put into place.   We have sent a referral to [facility] for your MRI and they will call you directly to schedule your appointment. They are located at [address] [facility]. If you need to contact them directly please call [phone].

## 2020-06-29 ENCOUNTER — Other Ambulatory Visit: Payer: Self-pay

## 2020-06-29 ENCOUNTER — Ambulatory Visit: Payer: Medicare Other | Admitting: Psychology

## 2020-06-29 ENCOUNTER — Ambulatory Visit (INDEPENDENT_AMBULATORY_CARE_PROVIDER_SITE_OTHER): Payer: Medicare Other | Admitting: Psychology

## 2020-06-29 ENCOUNTER — Encounter: Payer: Self-pay | Admitting: Psychology

## 2020-06-29 DIAGNOSIS — G3184 Mild cognitive impairment, so stated: Secondary | ICD-10-CM | POA: Insufficient documentation

## 2020-06-29 DIAGNOSIS — R011 Cardiac murmur, unspecified: Secondary | ICD-10-CM | POA: Insufficient documentation

## 2020-06-29 DIAGNOSIS — R4189 Other symptoms and signs involving cognitive functions and awareness: Secondary | ICD-10-CM

## 2020-06-29 HISTORY — DX: Mild cognitive impairment of uncertain or unknown etiology: G31.84

## 2020-06-29 NOTE — Progress Notes (Addendum)
NEUROPSYCHOLOGICAL EVALUATION [place] Department of Neurology  Date of Evaluation: June 29, 2020  Reason for Referral:   Sarah LawsSusan L Christian is a 74 y.o. right-handed Caucasian female referred by Patrcia DollyKaren Aquino, M.D., to characterize her current cognitive functioning and assist with diagnostic clarity and treatment planning in the context of subjective cognitive decline and family history of Alzheimer's disease.   Assessment and Plan:   Clinical Impression(s): Sarah Christian's pattern of performance is suggestive of impairment across all aspects of verbal memory. Additional relative weaknesses (i.e., below average range) were exhibited across aspects of visual memory, as well as verbal fluency. Performance was appropriate across processing speed, attention/concentration, executive functioning, safety/judgment, receptive language, confrontation naming, and visuospatial abilities. Sarah Christian denied difficulties completing instrumental activities of daily living (ADLs) independently. 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  The etiology of her isolated verbal memory impairment is unclear. Ongoing memory dysfunction certainly raises the risk for the presence of Alzheimer's disease. However, Sarah Christian's performance was quite strong in other non-memory areas commonly affected by this condition, even in earlier stages. There is no neuroimaging available to examine possible anatomical correlates for ongoing dysfunction. She does have a family history of Alzheimer's disease and continued medical monitoring will be very important for her moving forward. Memory dysfunction is above and beyond what would be expected from stress or mild mood-related concerns alone. Cognitive and behavioral characteristics were not consistent with other forms of  neurodegenerative illness, such as Lewy body or frontotemporal dementia.   Recommendations: A repeat neuropsychological evaluation in 18 months (or sooner if functional decline is noted) is recommended to assess the trajectory of future cognitive decline should it occur. This will also aid in future efforts towards improved diagnostic clarity.  It appears that a brain MRI has been ordered but is not yet scheduled. I would strongly encourage Sarah Christian to schedule and complete neuroimaging. This will be important in understanding if there are any anatomical explanations for ongoing memory loss. It will also allow for the tracking of changes over time.   If interested, Sarah Christian could speak with Dr. Karel JarvisAquino regarding the pros and cons of a acetylcholinesterase inhibitor medication for ongoing memory loss. However, it is important to highlight that in the face of a degenerative condition, these medications may slow functional decline and cannot stop or reverse cognitive changes.   It will be important for Sarah Christian to have another person with her when in situations where she may need to process information, weigh the pros and cons of different options, and make decisions, in order to ensure that she fully understands and recalls all information to be considered.  If not already done, Sarah Christian and her family may want to discuss her wishes regarding durable power of attorney and medical decision making, so that she can have input into these choices. Additionally, they may wish to discuss future plans for caretaking and seek out community options for in home/residential care should they become necessary.  Sarah Christian is encouraged to attend to lifestyle factors for brain health (e.g., regular physical exercise, good nutrition habits, regular participation in cognitively-stimulating activities, and general stress management techniques), which are likely to have benefits for both emotional adjustment and  cognition. Continued participation in activities which provide mental stimulation and social interaction is also recommended.   Information important to remember should be provided  in written format in all instances. This should be placed in a highly visible and commonly frequented area of her home to promote recall.  Review of Records:   Ms. Brandenburger was seen by [facility] Patrcia Dolly, M.D.) on 05/15/2020 for follow-up of reported memory loss. Briefly, Ms. Kehres reported family members noticing memory changes towards the end of 2020 and early 2021. She helps take care of her grandchildren and her daughter-in-law noticed there were certain things Ms. Yetter may not remember and would have to ask again. Her son also reportedly talked to her about not remembering the schedule for her grandchild. She typically lives alone and denied difficulties performing ADLs independently. Acutely, she reported poor sleep and increased stress due to family dynamic difficulties. Specifically, her ex-husband recently moved from [place] to live with their son in [place]. However, living issues arose and her ex-husband eventually moved in with Ms. Woodford. She reported significantly decreased stress and the ability to resume her normal routine following her ex-husband moving back out. She denied ongoing headaches, dizziness, focal numbness/tingling/weakness, balance concerns, sleep dysfunction, or psychiatric distress. Performance on a brief cognitive screening instrument (MOCA) was 25/30. Ultimately, Ms. Lawson was referred for a comprehensive neuropsychological evaluation to characterize her cognitive abilities and to assist with diagnostic clarity and treatment planning.   During her clinical interview, she made comments both to myself and the psychometrist independently that she had previously been to this office and completed a neuropsychological evaluation. However, I have no records of ever meeting or evaluating  Sarah Christian previously, nor do I have records of her completing a previous evaluation at another clinic or with another provider.   A brain MRI had been ordered but not completed at the time of the evaluation. As such, no neuroimaging was available for review.   Past Medical History:  Diagnosis Date  . Heart murmur   . LBBB (left bundle branch block) 12/21/2019    Past Surgical History:  Procedure Laterality Date  . AMPUTATION OF REPLICATED TOES Bilateral     Current Outpatient Medications:  .  acetaminophen (TYLENOL) 325 MG tablet, Take 650 mg by mouth every 6 (six) hours as needed. (Patient not taking: Reported on 05/15/2020), Disp: , Rfl:   Clinical Interview:   The following information was obtained during a clinical interview with Sarah Christian prior to cognitive testing.  Cognitive Symptoms: Decreased short-term memory: Largely denied. She did acknowledge instances in the recent past where she doubted her abilities to a generally mild extent. However, this was attributed to ongoing family-related stress (as described above), as well as self-isolation due to the ongoing COVID-19 pandemic.  Decreased long-term memory: Denied. Decreased attention/concentration: Denied. Reduced processing speed: Denied. Difficulties with executive functions: Denied. Difficulties with emotion regulation: Denied. Difficulties with receptive language: Denied. Difficulties with word finding: Denied. Decreased visuoperceptual ability: Denied.  Difficulties completing ADLs: Denied. She currently lives alone.   Additional Medical History: History of traumatic brain injury/concussion: Denied. History of stroke: Denied. History of seizure activity: Denied. History of known exposure to toxins: Denied. Symptoms of chronic pain: Denied. She did acknowledge diffuse pain when she overexerts herself and noted that she has not been engaging in regular exercise lately. However, no specific areas of localized pain  were reported.  Experience of frequent headaches/migraines: Denied. Frequent instances of dizziness/vertigo: Denied.  Sensory changes: She wears reading glasses with positive effect. Other sensory changes/difficulties (e.g., hearing, taste, or smell) were denied.  Balance/coordination difficulties: Denied. She also denied a history of  falls.  Other motor difficulties: Denied.  Sleep History: Estimated hours obtained each night: 8 hours.  Difficulties falling asleep: Denied. Difficulties staying asleep: Denied. She acknowledged difficulties staying asleep in the recent past due to her ex-husband temporarily moving in with her and his schedule being disruptive. However, these difficulties subsided after he moved back out.  Feels rested and refreshed upon awakening: Endorsed.  History of snoring: Denied. History of waking up gasping for air: Denied. Witnessed breath cessation while asleep: Denied.  History of vivid dreaming: Denied. Excessive movement while asleep: Denied. Instances of acting out her dreams: Denied.  Psychiatric/Behavioral Health History: Depression: She described her current mood as "good" and denied to her knowledge ever being diagnosed with any mental health concerns in the past. Despite this, she did acknowledge negative symptoms stemming from elongated pandemic-related isolation. She further noted that she has seemed to not engage in activities which previously provided her enjoyment for largely unknown reasons. Current or remote suicidal ideation, intent, or plan were denied.  Anxiety: Denied. Mania: Denied. Trauma History: Denied. Visual/auditory hallucinations: Denied. Delusional thoughts: Denied.  Tobacco: Denied. Alcohol: She denied recent alcohol consumption but did express a desire to consume around 1/2 glass of wine per night. She denied a history of problematic alcohol abuse or dependence.  Recreational drugs: Denied. Caffeine: 2 cups of coffee in the  morning. This represents a decrease from a previously higher degree of caffeine intake (i.e., 5 cups of coffee).   Family History: Problem Relation Age of Onset  . Alzheimer's disease Mother        Symptom onset late 60s/early 64s  . Hypertension Mother   . Diabetes Sister   . Kidney disease Sister   . Diabetes Brother    This information was confirmed by Ms. Hollice Espy.  Academic/Vocational History: Highest level of educational attainment: 16 years. She graduated from high school and earned a Oncologist in social work. She described herself as a strong (A) student in academic settings and denied any areas of relative weakness.  History of developmental delay: Denied. History of grade repetition: Denied. Enrollment in special education courses: Denied. History of LD/ADHD: Denied.  Employment: Retired. 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   Evaluation Results:   Behavioral Observations: Ms. Chinchilla was unaccompanied, arrived to her appointment on time, and was appropriately dressed and groomed. She appeared alert and oriented. Observed gait and station were within normal limits. Gross motor functioning appeared intact upon informal observation and no abnormal movements (e.g., tremors) were noted. Her affect was generally relaxed and positive, but did range appropriately given the subject being discussed during the clinical interview or the task at hand during testing procedures. Spontaneous speech was fluent and word finding difficulties were not observed during the clinical interview. Thought processes during interview were coherent and normal in content. Insight into her cognitive difficulties appeared limited regarding memory abilities as she largely denied all concerns despite there being significant impairments across cognitive testing. During testing, she was often tangential and would  require prompts from the psychometrist to provide answers across several tasks. She was also noted to laugh following instructions; this was conceptualized as representing anxiety relating to the testing procedures. Sustained attention was appropriate. Task engagement was adequate and she persisted when challenged. Overall, Sarah Christian was cooperative with the clinical interview and subsequent testing procedures.    Adequacy of Effort: The validity of neuropsychological testing is  limited by the extent to which the individual being tested may be assumed to have exerted adequate effort during testing. Ms. Denley expressed her intention to perform to the best of her abilities and exhibited adequate task engagement and persistence. Scores across stand-alone and embedded performance validity measures were variable but largely within expectation. One below expectation performance is believed to be due to true memory dysfunction rather than poor engagement or attempts to perform poorly. As such, the results of the current evaluation are believed to be a valid representation of Sarah Christian's current cognitive functioning.  Test Results: Ms. Chicas was somewhat disoriented at the time of the current evaluation. She incorrectly stated her current age ("30"). She also was unable to state the current date, day of the week, or time.   Intellectual abilities based upon educational and vocational attainment were estimated to be in the average range. Premorbid abilities were estimated to be within the average range based upon a single-word reading test.   Processing speed was mildly variable, ranging from the below average to above average normative ranges. Basic attention was well above average. More complex attention (e.g., working memory) was average. Executive functioning was average to above average.  Assessed receptive language abilities were above average. Likewise, Ms. Glanzer did not exhibit any difficulties  comprehending task instructions and answered all questions asked of her appropriately. Assessed expressive language was below average across verbal fluency measures but above average across a confrontation naming task.   Assessed visuospatial/visuoconstructional abilities were average.    Learning (i.e., encoding) of novel verbal information was exceptionally low to well below average while learning visual information was below average. Spontaneous delayed recall (i.e., retrieval) of previously learned information was commensurate with performance across learning trials. Retention rates were 71% across a story learning task, 0% across a list learning task, and 67% across a shape learning task. Performance across recognition tasks was exceptionally low across verbal tasks but average across a visual task, suggesting limited but some evidence for information consolidation.   Results of emotional screening instruments suggested that recent symptoms of generalized anxiety were in the minimal range, while symptoms of depression were within normal limits. A screening instrument assessing recent sleep quality suggested the presence of minimal sleep dysfunction.  Tables of Scores:   Note: This summary of test scores accompanies the interpretive report and should not be considered in isolation without reference to the appropriate sections in the text. Descriptors are based on appropriate normative data and may be adjusted based on clinical judgment. The terms "impaired" and "within normal limits (WNL)" are used when a more specific level of functioning cannot be determined.       Effort Testing:   DESCRIPTOR       Dot Counting Test: --- --- Within Expectation  NAB EVI: --- --- Below Expectation  D-KEFS Color Word Effort Index: --- --- Within Expectation       Orientation:      Raw Score Percentile   NAB Orientation, Form 1 22/29 --- ---       Cognitive Screening:           Raw Score Percentile     SLUMS: 22/30 --- ---       Intellectual Functioning:           Standard Score Percentile   Test of Premorbid Functioning: 101 53 Average       Memory:          NAB Memory Module, Form 2: Standard Score/  T Score Percentile   Total Memory Index 61 <1 Exceptionally Low  List Learning       Total Trials 1-3 8/36 (19) <1 Exceptionally Low    List B 6/12 (59) 82 Above Average    Short Delay Free Recall 0/12 (19) <1 Exceptionally Low    Long Delay Free Recall 0/12 (22) <1 Exceptionally Low    Retention Percentage 0 (26) 1 Exceptionally Low    Recognition Discriminability -7 (8) <1 Exceptionally Low  Shape Learning       Total Trials 1-3 12/27 (38) 12 Below Average    Delayed Recall 4/9 (39) 14 Below Average    Retention Percentage 67 (39) 14 Below Average    Recognition Discriminability 6 (46) 34 Average  Story Learning       Immediate Recall 35/80 (34) 5 Well Below Average    Delayed Recall 15/40 (33) 5 Well Below Average    Retention Percentage 71 (43) 25 Average  Daily Living Memory       Immediate Recall 36/51 (34) 5 Well Below Average    Delayed Recall 0/17 (19) <1 Exceptionally Low    Retention Percentage 0 (-7) <1 Exceptionally Low    Recognition Hits 2/10 (7) <1 Exceptionally Low       Attention/Executive Function:          Trail Making Test (TMT): Raw Score (T Score) Percentile     Part A 32 secs.,  0 errors (52) 58 Average    Part B 74 secs.,  1 error (54) 66 Average         Scaled Score Percentile   WAIS-IV Coding: 11 63 Average       NAB Attention Module, Form 1: T Score Percentile     Digits Forward 68 96 Well Above Average    Digits Backwards 55 69 Average       D-KEFS Color-Word Interference Test: Raw Score (Scaled Score) Percentile     Color Naming 41 secs. (7) 16 Below Average    Word Reading 22 secs. (12) 75 Above Average    Inhibition 78 secs. (9) 37 Average      Total Errors 4 errors (9) 37 Average    Inhibition/Switching 57 secs. (13) 84 Above  Average      Total Errors 0 errors (13) 84 Above Average       [employer] Test: Raw Score Percentile     Categories (trials) 4 (64) >16 Within Normal Limits    Total Errors 15 79 Above Average    Perseverative Errors 10 62 Average    Non-Perseverative Errors 5 84 Above Average    Failure to Maintain Set 0 --- ---       NAB Executive Functions Module, Form 1: T Score Percentile     Judgment 53 62 Average       Language:          Verbal Fluency Test: Raw Score (T Score) Percentile     Phonemic Fluency (FAS) 31 (40) 16 Below Average    Animal Fluency 16 (41) 18 Below Average        NAB Language Module, Form 1: T Score Percentile     Auditory Comprehension 57 75 Above Average    Naming 31/31 (58) 79 Above Average       Visuospatial/Visuoconstruction:      Raw Score Percentile   Clock Drawing: 10/10 --- Within Normal Limits       NAB Spatial Module, Form 1:  T Score Percentile     Figure Drawing Copy 50 50 Average        Scaled Score Percentile   WAIS-IV Block Design: 9 37 Average       Mood and Personality:      Raw Score Percentile   Geriatric Depression Scale: 5 --- Within Normal Limits  Geriatric Anxiety Scale: 5 --- Minimal    Somatic 1 --- Minimal    Cognitive 1 --- Minimal    Affective 3 --- Minimal       Additional Questionnaires:      Raw Score Percentile   PROMIS Sleep Disturbance Questionnaire: 10 --- None to Slight   Informed Consent and Coding/Compliance:   Ms. Senseney was provided with a verbal description of the nature and purpose of the present neuropsychological evaluation. Also reviewed were the foreseeable risks and/or discomforts and benefits of the procedure, limits of confidentiality, and mandatory reporting requirements of this provider. The patient was given the opportunity to ask questions and receive answers about the evaluation. Oral consent to participate was provided by the patient.   This evaluation was conducted by Newman Nickels,  Ph.D., licensed clinical neuropsychologist. Ms. Beavin completed a comprehensive clinical interview with Dr. Milbert Coulter, billed as one unit [phone], and 150 minutes of cognitive testing and scoring, billed as one unit [phone] and four additional units [id number]. Psychometrist Wallace Keller, B.S., assisted Dr. Milbert Coulter with test administration and scoring procedures. As a separate and discrete service, Dr. Milbert Coulter spent a total of 160 minutes in interpretation and report writing billed as one unit [phone] and two units [id number].

## 2020-06-29 NOTE — Progress Notes (Signed)
   Psychometrician Note   Cognitive testing was administered to Sarah Christian by Wallace Keller, B.S. (psychometrist) under the supervision of Dr. Newman Nickels, Ph.D., licensed psychologist on 06/29/20. [name] did not appear overtly distressed by the testing session per behavioral observation or responses across self-report questionnaires. Dr. Newman Nickels, Ph.D. checked in with [name] as needed to manage any distress related to testing procedures (if applicable). Rest breaks were offered.    The battery of tests administered was selected by Dr. Newman Nickels, Ph.D. with consideration to [name]'s current level of functioning, the nature of her symptoms, emotional and behavioral responses during interview, level of literacy, observed level of motivation/effort, and the nature of the referral question. This battery was communicated to the psychometrist. Communication between Dr. Newman Nickels, Ph.D. and the psychometrist was ongoing throughout the evaluation and Dr. Newman Nickels, Ph.D. was immediately accessible at all times. Dr. Newman Nickels, Ph.D. provided supervision to the psychometrist on the date of this service to the extent necessary to assure the quality of all services provided.    Sarah Christian will return within approximately 1-2 weeks for an interactive feedback session with Dr. Milbert Coulter at which time her test performances, clinical impressions, and treatment recommendations will be reviewed in detail. [name] understands she can contact our office should she require our assistance before this time.  A total of 150 minutes of billable time were spent face-to-face with [name] by the psychometrist. This includes both test administration and scoring time. Billing for these services is reflected in the clinical report generated by Dr. Newman Nickels, Ph.D..  This note reflects time spent with the psychometrician and does not include test scores or any clinical  interpretations made by Dr. Milbert Coulter. The full report will follow in a separate note.

## 2020-06-30 ENCOUNTER — Encounter: Payer: Self-pay | Admitting: Psychology

## 2020-07-11 ENCOUNTER — Encounter: Payer: Medicare Other | Admitting: Psychology

## 2020-07-13 ENCOUNTER — Other Ambulatory Visit: Payer: Self-pay

## 2020-07-13 ENCOUNTER — Ambulatory Visit (INDEPENDENT_AMBULATORY_CARE_PROVIDER_SITE_OTHER): Payer: Medicare Other | Admitting: Psychology

## 2020-07-13 DIAGNOSIS — R4181 Age-related cognitive decline: Secondary | ICD-10-CM

## 2020-07-13 DIAGNOSIS — G3184 Mild cognitive impairment, so stated: Secondary | ICD-10-CM

## 2020-07-13 NOTE — Patient Instructions (Signed)
A repeat neuropsychological evaluation in 18 months (or sooner if functional decline is noted) is recommended to assess the trajectory of future cognitive decline should it occur. This will also aid in future efforts towards improved diagnostic clarity.  It appears that a brain MRI has been ordered but is not yet scheduled. I would strongly encourage Sarah Christian to schedule and complete neuroimaging. This will be important in understanding if there are any anatomical explanations for ongoing memory loss. It will also allow for the tracking of changes over time.   If interested, Sarah Christian could speak with Dr. Karel Jarvis regarding the pros and cons of a acetylcholinesterase inhibitor medication for ongoing memory loss. However, it is important to highlight that in the face of a degenerative condition, these medications may slow functional decline and cannot stop or reverse cognitive changes.   It will be important for Sarah Christian to have another person with her when in situations where she may need to process information, weigh the pros and cons of different options, and make decisions, in order to ensure that she fully understands and recalls all information to be considered.  If not already done, Sarah Christian and her family may want to discuss her wishes regarding durable power of attorney and medical decision making, so that she can have input into these choices. Additionally, they may wish to discuss future plans for caretaking and seek out community options for in home/residential care should they become necessary.  Sarah Christian is encouraged to attend to lifestyle factors for brain health (e.g., regular physical exercise, good nutrition habits, regular participation in cognitively-stimulating activities, and general stress management techniques), which are likely to have benefits for both emotional adjustment and cognition. Continued participation in activities which provide mental stimulation and social  interaction is also recommended.   Information important to remember should be provided in written format in all instances. This should be placed in a highly visible and commonly frequented area of her home to promote recall.

## 2020-07-13 NOTE — Progress Notes (Signed)
   Neuropsychology Feedback Session [name]. [facility] [place] Department of Neurology  Reason for Referral:   Sarah Christian a 74 y.o. right-handed Caucasian female referred by Patrcia Dolly, M.D.,to characterize hercurrent cognitive functioning and assist with diagnostic clarity and treatment planning in the context of subjective cognitive decline and family history of Alzheimer's disease.   Feedback:   [name] completed a comprehensive neuropsychological evaluation on 06/29/2020. Please refer to that encounter for the full report and recommendations. Briefly, results suggested impairment across all aspects of verbal memory. Additional relative weaknesses (i.e., below average range) were exhibited across aspects of visual memory, as well as verbal fluency. The etiology of her isolated verbal memory impairment is unclear. Ongoing memory dysfunction certainly raises the risk for the presence of Alzheimer's disease. However, [name] performance was quite strong in other non-memory areas commonly affected by this condition, even in earlier stages. There is no neuroimaging available to examine possible anatomical correlates for ongoing dysfunction. She does have a family history of Alzheimer's disease and continued medical monitoring will be very important for her moving forward. Memory dysfunction is above and beyond what would be expected from stress or mild mood-related concerns alone.  [name] was unaccompanied during the feedback session. Content of the current session focused on the results of her neuropsychological evaluation. [name] was given the opportunity to ask questions and her questions were answered. She was encouraged to reach out should additional questions arise. A copy of her report was provided at the conclusion of the visit.      25 minutes were spent conducting the current feedback session with [name], billed as one unit [phone].

## 2020-08-16 DIAGNOSIS — K0510 Chronic gingivitis, plaque induced: Secondary | ICD-10-CM | POA: Diagnosis not present

## 2020-09-21 IMAGING — MG DIGITAL SCREENING BILAT W/ TOMO W/ CAD
8 series · 9 of 24 positions shown · non-contrast
Comparison: Previous exam(s).

CLINICAL DATA: Screening.

EXAM:
DIGITAL SCREENING BILATERAL MAMMOGRAM WITH TOMO AND CAD

[L CC synth-2D]
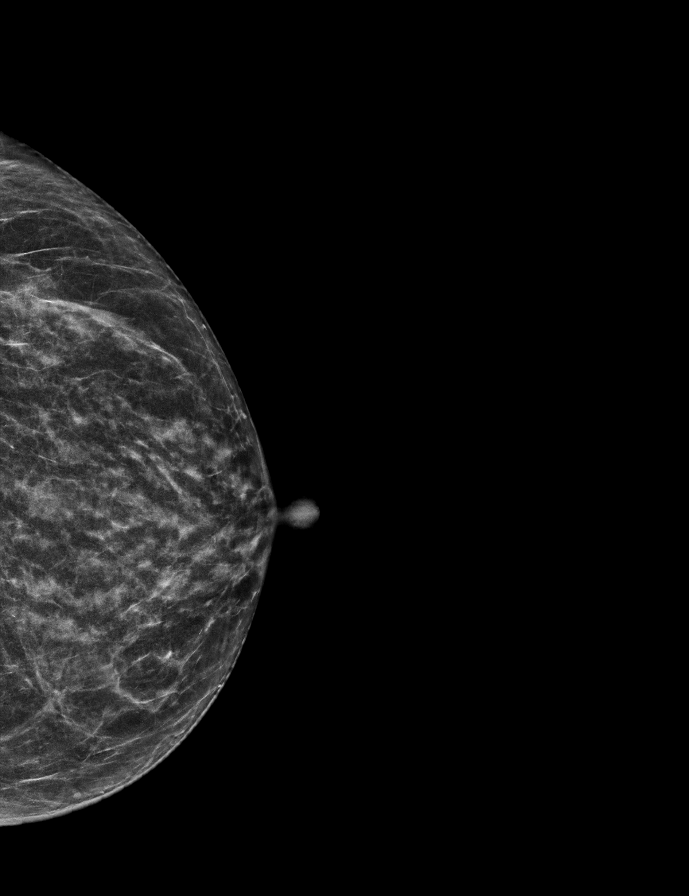

[R MLO synth-2D]
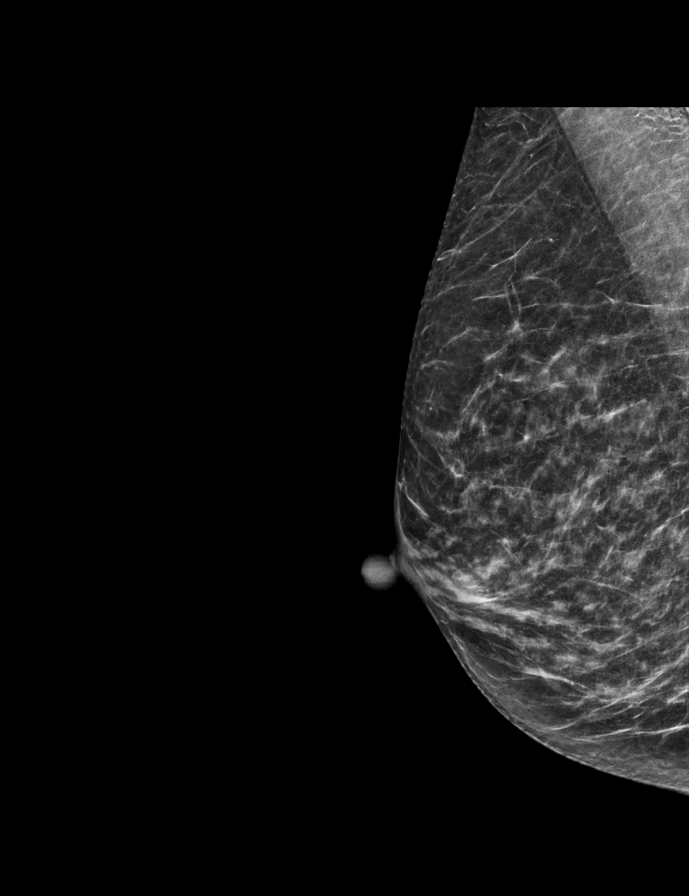

[R CC synth-2D]
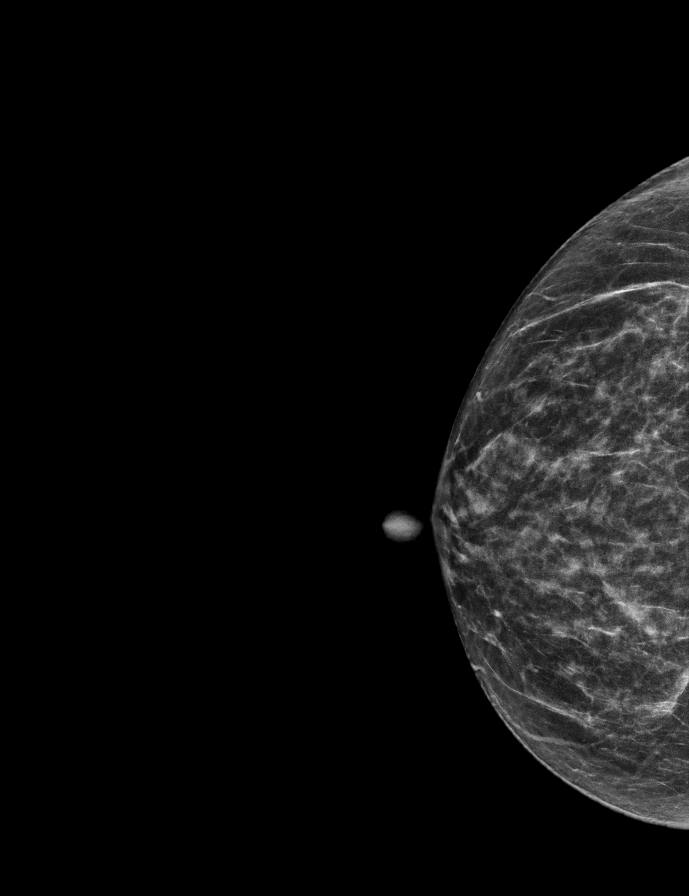

[L MLO synth-2D]
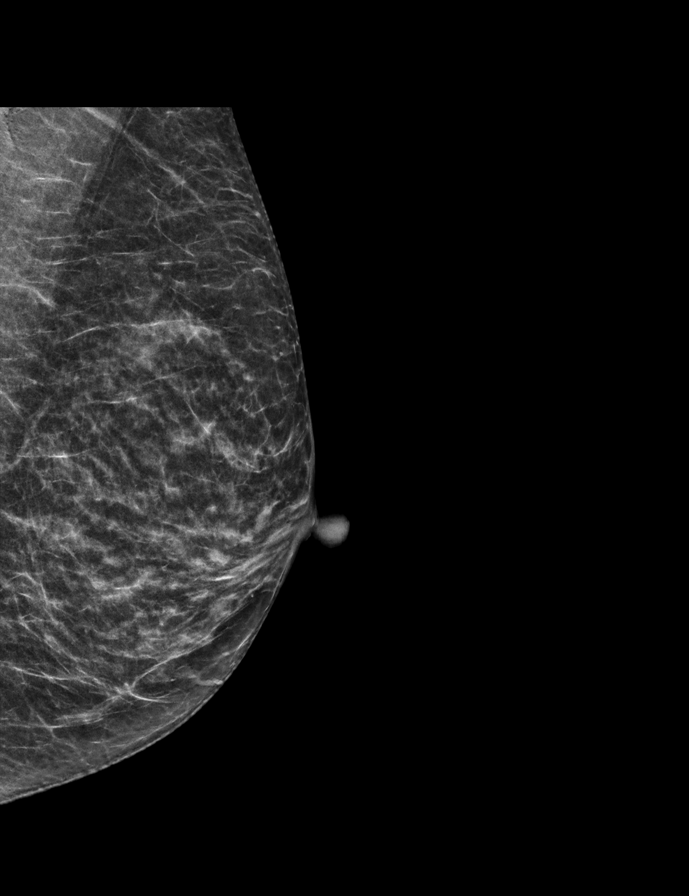

[L MLO tomo · 2 of 48 frames shown]
[frame 16/48]
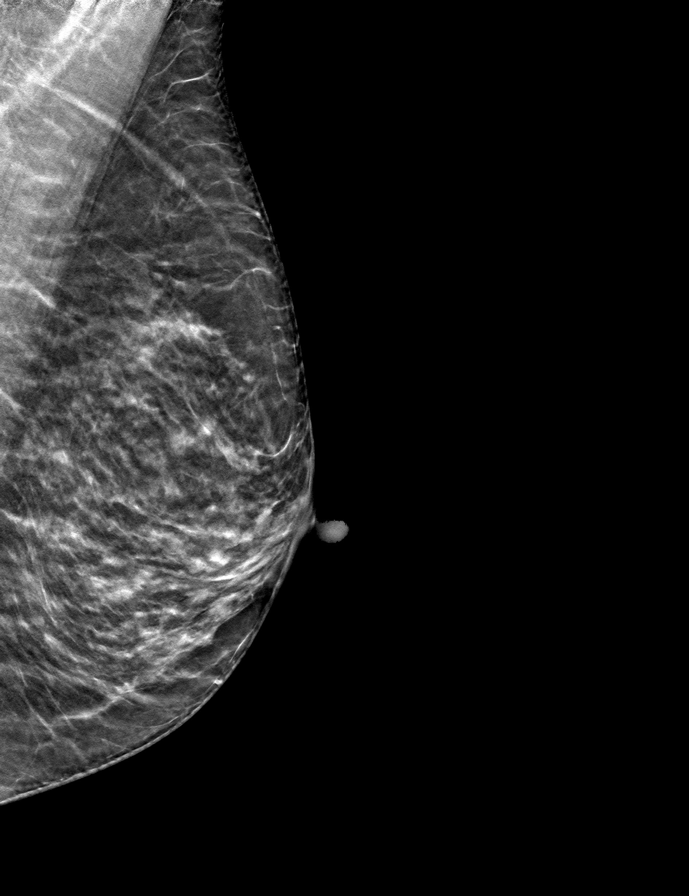
[frame 25/48]
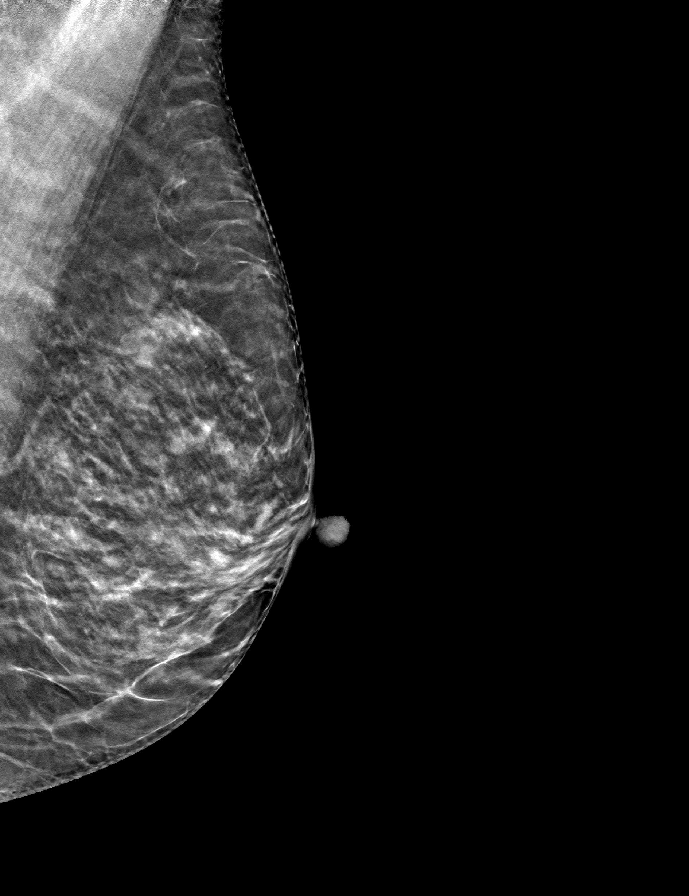

[L CC tomo · tomo slice 27/52.0]
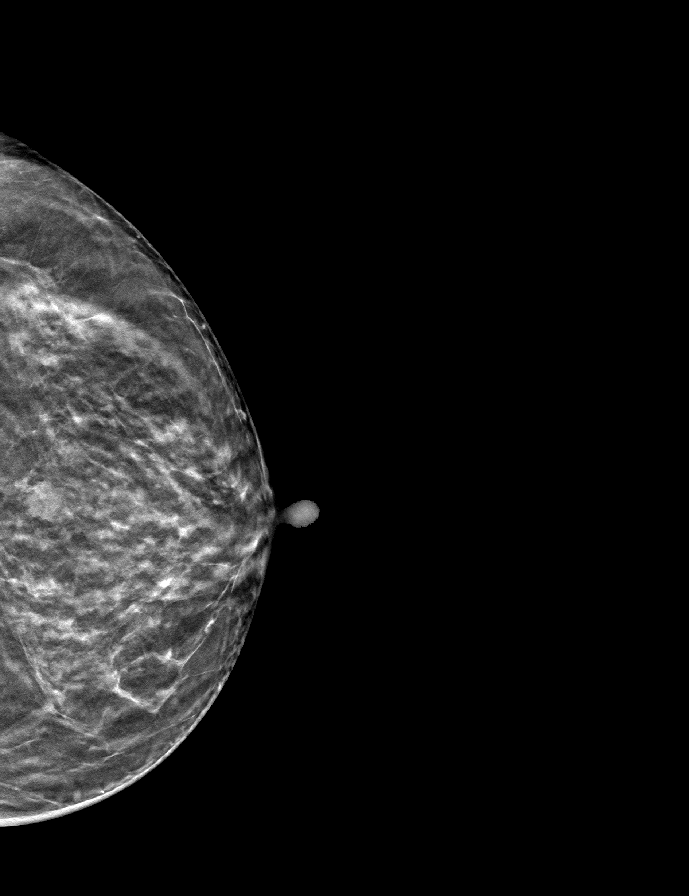

[R CC tomo · tomo slice 27/52.0]
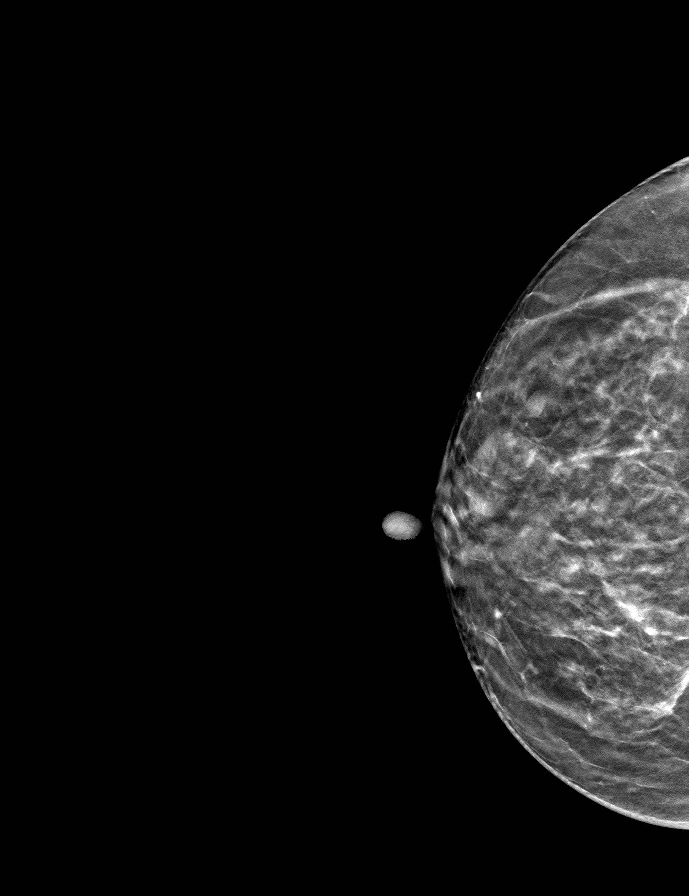

[R MLO tomo · tomo slice 25/50.0]
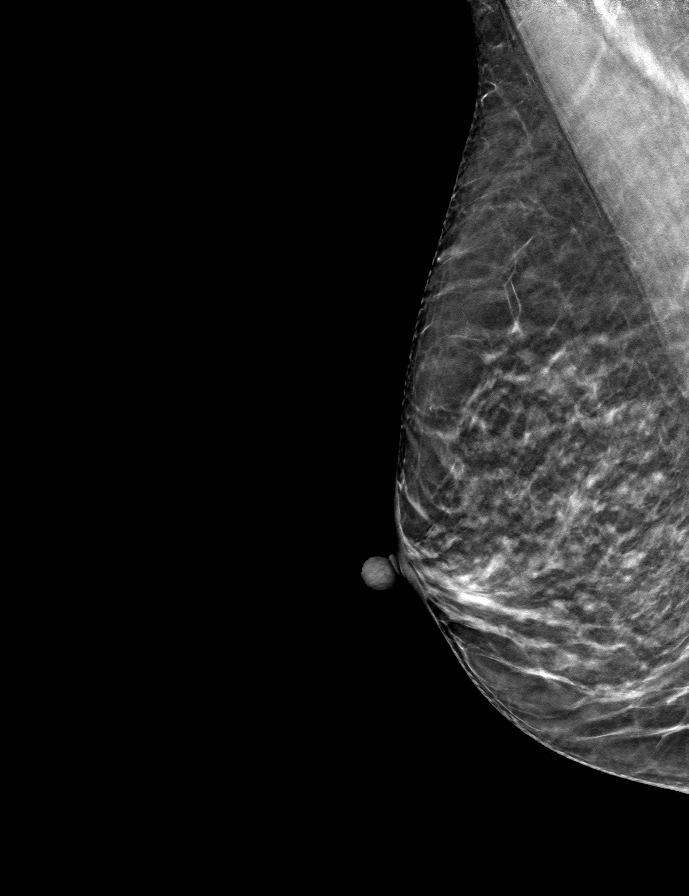

[9 of 24 positions shown; findings below may reference images not displayed]

ACR Breast Density Category b: There are scattered areas of
fibroglandular density.
FINDINGS: There are no findings suspicious for malignancy. Images were
processed with CAD.
IMPRESSION: No mammographic evidence of malignancy. A result letter of this
screening mammogram will be mailed directly to the patient.

RECOMMENDATION:
Screening mammogram in one year. (Code:[id number])

BI-RADS CATEGORY  1: Negative.

## 2020-12-14 DIAGNOSIS — W57XXXA Bitten or stung by nonvenomous insect and other nonvenomous arthropods, initial encounter: Secondary | ICD-10-CM | POA: Diagnosis not present

## 2020-12-14 DIAGNOSIS — S40869A Insect bite (nonvenomous) of unspecified upper arm, initial encounter: Secondary | ICD-10-CM | POA: Diagnosis not present

## 2021-01-01 ENCOUNTER — Ambulatory Visit: Payer: Medicare Other | Admitting: Neurology

## 2021-01-17 DIAGNOSIS — Z03818 Encounter for observation for suspected exposure to other biological agents ruled out: Secondary | ICD-10-CM | POA: Diagnosis not present

## 2021-01-17 DIAGNOSIS — L309 Dermatitis, unspecified: Secondary | ICD-10-CM | POA: Diagnosis not present

## 2021-01-17 DIAGNOSIS — J029 Acute pharyngitis, unspecified: Secondary | ICD-10-CM | POA: Diagnosis not present

## 2021-01-30 DIAGNOSIS — J029 Acute pharyngitis, unspecified: Secondary | ICD-10-CM | POA: Diagnosis not present

## 2021-03-06 DIAGNOSIS — Z91030 Bee allergy status: Secondary | ICD-10-CM | POA: Diagnosis not present

## 2021-03-30 DIAGNOSIS — E538 Deficiency of other specified B group vitamins: Secondary | ICD-10-CM | POA: Diagnosis not present

## 2021-03-30 DIAGNOSIS — E611 Iron deficiency: Secondary | ICD-10-CM | POA: Diagnosis not present

## 2021-03-30 DIAGNOSIS — E559 Vitamin D deficiency, unspecified: Secondary | ICD-10-CM | POA: Diagnosis not present

## 2021-03-30 DIAGNOSIS — Z5181 Encounter for therapeutic drug level monitoring: Secondary | ICD-10-CM | POA: Diagnosis not present

## 2021-04-13 DIAGNOSIS — E538 Deficiency of other specified B group vitamins: Secondary | ICD-10-CM | POA: Diagnosis not present

## 2021-04-20 DIAGNOSIS — E538 Deficiency of other specified B group vitamins: Secondary | ICD-10-CM | POA: Diagnosis not present

## 2021-04-30 DIAGNOSIS — Z79899 Other long term (current) drug therapy: Secondary | ICD-10-CM | POA: Diagnosis not present

## 2021-04-30 DIAGNOSIS — E538 Deficiency of other specified B group vitamins: Secondary | ICD-10-CM | POA: Diagnosis not present

## 2021-04-30 DIAGNOSIS — R413 Other amnesia: Secondary | ICD-10-CM | POA: Diagnosis not present

## 2022-01-17 DIAGNOSIS — M810 Age-related osteoporosis without current pathological fracture: Secondary | ICD-10-CM | POA: Diagnosis not present

## 2022-01-17 DIAGNOSIS — Z0000 Encounter for general adult medical examination without abnormal findings: Secondary | ICD-10-CM | POA: Diagnosis not present

## 2022-01-17 DIAGNOSIS — Z1231 Encounter for screening mammogram for malignant neoplasm of breast: Secondary | ICD-10-CM | POA: Diagnosis not present

## 2022-01-17 DIAGNOSIS — Z1211 Encounter for screening for malignant neoplasm of colon: Secondary | ICD-10-CM | POA: Diagnosis not present

## 2022-01-17 DIAGNOSIS — Z91030 Bee allergy status: Secondary | ICD-10-CM | POA: Diagnosis not present

## 2022-01-18 DIAGNOSIS — Z1159 Encounter for screening for other viral diseases: Secondary | ICD-10-CM | POA: Diagnosis not present

## 2022-01-18 DIAGNOSIS — E785 Hyperlipidemia, unspecified: Secondary | ICD-10-CM | POA: Diagnosis not present

## 2022-01-18 DIAGNOSIS — E611 Iron deficiency: Secondary | ICD-10-CM | POA: Diagnosis not present

## 2022-01-18 DIAGNOSIS — Z0000 Encounter for general adult medical examination without abnormal findings: Secondary | ICD-10-CM | POA: Diagnosis not present

## 2022-01-21 ENCOUNTER — Other Ambulatory Visit: Payer: Self-pay | Admitting: Family Medicine

## 2022-01-21 DIAGNOSIS — Z1211 Encounter for screening for malignant neoplasm of colon: Secondary | ICD-10-CM | POA: Diagnosis not present

## 2022-01-21 DIAGNOSIS — Z1231 Encounter for screening mammogram for malignant neoplasm of breast: Secondary | ICD-10-CM

## 2022-01-21 DIAGNOSIS — M810 Age-related osteoporosis without current pathological fracture: Secondary | ICD-10-CM

## 2022-03-27 DIAGNOSIS — R319 Hematuria, unspecified: Secondary | ICD-10-CM | POA: Diagnosis not present

## 2022-03-27 DIAGNOSIS — N390 Urinary tract infection, site not specified: Secondary | ICD-10-CM | POA: Diagnosis not present

## 2022-06-25 DIAGNOSIS — Z23 Encounter for immunization: Secondary | ICD-10-CM | POA: Diagnosis not present

## 2022-06-25 DIAGNOSIS — R3129 Other microscopic hematuria: Secondary | ICD-10-CM | POA: Diagnosis not present

## 2022-06-25 DIAGNOSIS — M545 Low back pain, unspecified: Secondary | ICD-10-CM | POA: Diagnosis not present

## 2022-06-25 DIAGNOSIS — W07XXXA Fall from chair, initial encounter: Secondary | ICD-10-CM | POA: Diagnosis not present

## 2024-02-17 DIAGNOSIS — T63481A Toxic effect of venom of other arthropod, accidental (unintentional), initial encounter: Secondary | ICD-10-CM | POA: Diagnosis not present

## 2024-02-17 DIAGNOSIS — Z23 Encounter for immunization: Secondary | ICD-10-CM | POA: Diagnosis not present

## 2024-02-17 DIAGNOSIS — L602 Onychogryphosis: Secondary | ICD-10-CM | POA: Diagnosis not present

## 2024-03-01 DIAGNOSIS — E559 Vitamin D deficiency, unspecified: Secondary | ICD-10-CM | POA: Diagnosis not present

## 2024-03-01 DIAGNOSIS — Z23 Encounter for immunization: Secondary | ICD-10-CM | POA: Diagnosis not present

## 2024-03-01 DIAGNOSIS — R413 Other amnesia: Secondary | ICD-10-CM | POA: Diagnosis not present

## 2024-03-01 DIAGNOSIS — E785 Hyperlipidemia, unspecified: Secondary | ICD-10-CM | POA: Diagnosis not present

## 2024-03-01 DIAGNOSIS — Z0000 Encounter for general adult medical examination without abnormal findings: Secondary | ICD-10-CM | POA: Diagnosis not present

## 2024-03-01 DIAGNOSIS — Z800 Family history of malignant neoplasm of digestive organs: Secondary | ICD-10-CM | POA: Diagnosis not present
